# Patient Record
Sex: Female | Born: 1980 | Race: White | Hispanic: No | Marital: Married | Smoking: Never smoker
Health system: Southern US, Community
[De-identification: ages and names within clinical notes are randomized; demographics above are authoritative.]

## PROBLEM LIST (undated history)

## (undated) HISTORY — PX: BREAST SURGERY: SHX581

## (undated) HISTORY — PX: WISDOM TOOTH EXTRACTION: SHX21

## (undated) HISTORY — PX: AUGMENTATION MAMMAPLASTY: SUR837

---

## 1999-04-13 ENCOUNTER — Other Ambulatory Visit: Admission: RE | Admit: 1999-04-13 | Discharge: 1999-04-13 | Payer: Self-pay | Admitting: Family Medicine

## 1999-05-31 ENCOUNTER — Encounter: Payer: Self-pay | Admitting: Family Medicine

## 1999-05-31 ENCOUNTER — Ambulatory Visit (HOSPITAL_COMMUNITY): Admission: RE | Admit: 1999-05-31 | Discharge: 1999-05-31 | Payer: Self-pay | Admitting: Family Medicine

## 1999-08-18 ENCOUNTER — Encounter: Payer: Self-pay | Admitting: Family Medicine

## 1999-08-18 ENCOUNTER — Ambulatory Visit (HOSPITAL_COMMUNITY): Admission: RE | Admit: 1999-08-18 | Discharge: 1999-08-18 | Payer: Self-pay | Admitting: Family Medicine

## 1999-09-03 ENCOUNTER — Inpatient Hospital Stay (HOSPITAL_COMMUNITY): Admission: AD | Admit: 1999-09-03 | Discharge: 1999-09-03 | Payer: Self-pay | Admitting: Family Medicine

## 1999-09-08 ENCOUNTER — Encounter: Payer: Self-pay | Admitting: Family Medicine

## 1999-09-08 ENCOUNTER — Ambulatory Visit (HOSPITAL_COMMUNITY): Admission: RE | Admit: 1999-09-08 | Discharge: 1999-09-08 | Payer: Self-pay | Admitting: Family Medicine

## 1999-09-15 ENCOUNTER — Encounter (HOSPITAL_COMMUNITY): Admission: RE | Admit: 1999-09-15 | Discharge: 1999-10-11 | Payer: Self-pay | Admitting: Family Medicine

## 1999-10-03 ENCOUNTER — Encounter: Payer: Self-pay | Admitting: Family Medicine

## 1999-10-10 ENCOUNTER — Inpatient Hospital Stay (HOSPITAL_COMMUNITY): Admission: AD | Admit: 1999-10-10 | Discharge: 1999-10-13 | Payer: Self-pay | Admitting: Family Medicine

## 1999-10-10 ENCOUNTER — Encounter (INDEPENDENT_AMBULATORY_CARE_PROVIDER_SITE_OTHER): Payer: Self-pay | Admitting: Specialist

## 1999-10-14 ENCOUNTER — Encounter: Admission: RE | Admit: 1999-10-14 | Discharge: 2000-01-10 | Payer: Self-pay | Admitting: Family Medicine

## 1999-10-19 ENCOUNTER — Ambulatory Visit (HOSPITAL_COMMUNITY): Admission: RE | Admit: 1999-10-19 | Discharge: 1999-10-19 | Payer: Self-pay | Admitting: Pediatrics

## 2006-08-30 LAB — CONVERTED CEMR LAB: Pap Smear: NORMAL

## 2006-11-29 ENCOUNTER — Ambulatory Visit: Payer: Self-pay | Admitting: Internal Medicine

## 2006-11-29 DIAGNOSIS — F329 Major depressive disorder, single episode, unspecified: Secondary | ICD-10-CM

## 2006-11-29 DIAGNOSIS — F411 Generalized anxiety disorder: Secondary | ICD-10-CM | POA: Insufficient documentation

## 2006-12-03 ENCOUNTER — Encounter (INDEPENDENT_AMBULATORY_CARE_PROVIDER_SITE_OTHER): Payer: Self-pay | Admitting: Internal Medicine

## 2007-05-19 ENCOUNTER — Ambulatory Visit (HOSPITAL_COMMUNITY): Admission: RE | Admit: 2007-05-19 | Discharge: 2007-05-19 | Payer: Self-pay | Admitting: Family Medicine

## 2009-02-23 ENCOUNTER — Inpatient Hospital Stay (HOSPITAL_COMMUNITY): Admission: AD | Admit: 2009-02-23 | Discharge: 2009-02-23 | Payer: Self-pay | Admitting: Obstetrics and Gynecology

## 2009-03-23 ENCOUNTER — Inpatient Hospital Stay (HOSPITAL_COMMUNITY): Admission: AD | Admit: 2009-03-23 | Discharge: 2009-03-26 | Payer: Self-pay | Admitting: Obstetrics and Gynecology

## 2011-01-08 LAB — CBC
HCT: 32.6 % — ABNORMAL LOW (ref 36.0–46.0)
HCT: 36.2 % (ref 36.0–46.0)
HCT: 36.5 % (ref 36.0–46.0)
Hemoglobin: 12.8 g/dL (ref 12.0–15.0)
Hemoglobin: 13.1 g/dL (ref 12.0–15.0)
MCHC: 35.3 g/dL (ref 30.0–36.0)
MCHC: 35.8 g/dL (ref 30.0–36.0)
MCHC: 35.9 g/dL (ref 30.0–36.0)
MCV: 98.4 fL (ref 78.0–100.0)
MCV: 98.9 fL (ref 78.0–100.0)
MCV: 99.1 fL (ref 78.0–100.0)
Platelets: 182 10*3/uL (ref 150–400)
Platelets: 228 10*3/uL (ref 150–400)
RBC: 3.66 MIL/uL — ABNORMAL LOW (ref 3.87–5.11)
RBC: 3.71 MIL/uL — ABNORMAL LOW (ref 3.87–5.11)
RDW: 13.1 % (ref 11.5–15.5)
RDW: 13.4 % (ref 11.5–15.5)
WBC: 7.3 10*3/uL (ref 4.0–10.5)
WBC: 9.8 10*3/uL (ref 4.0–10.5)

## 2011-01-08 LAB — CCBB MATERNAL DONOR DRAW

## 2011-01-09 LAB — URINALYSIS, ROUTINE W REFLEX MICROSCOPIC
Bilirubin Urine: NEGATIVE
Hgb urine dipstick: NEGATIVE
Ketones, ur: NEGATIVE mg/dL
Nitrite: NEGATIVE
Protein, ur: NEGATIVE mg/dL
Specific Gravity, Urine: 1.015 (ref 1.005–1.030)
Urobilinogen, UA: 0.2 mg/dL (ref 0.0–1.0)

## 2017-11-29 DIAGNOSIS — Z6821 Body mass index (BMI) 21.0-21.9, adult: Secondary | ICD-10-CM | POA: Diagnosis not present

## 2017-11-29 DIAGNOSIS — Z01419 Encounter for gynecological examination (general) (routine) without abnormal findings: Secondary | ICD-10-CM | POA: Diagnosis not present

## 2017-11-29 LAB — HM PAP SMEAR: HM PAP: NEGATIVE

## 2018-01-01 DIAGNOSIS — R1110 Vomiting, unspecified: Secondary | ICD-10-CM | POA: Diagnosis not present

## 2018-01-14 DIAGNOSIS — Z3009 Encounter for other general counseling and advice on contraception: Secondary | ICD-10-CM | POA: Diagnosis not present

## 2018-01-28 DIAGNOSIS — Z30431 Encounter for routine checking of intrauterine contraceptive device: Secondary | ICD-10-CM | POA: Diagnosis not present

## 2018-02-03 DIAGNOSIS — J301 Allergic rhinitis due to pollen: Secondary | ICD-10-CM | POA: Diagnosis not present

## 2018-02-05 DIAGNOSIS — R82998 Other abnormal findings in urine: Secondary | ICD-10-CM | POA: Diagnosis not present

## 2018-02-05 DIAGNOSIS — R102 Pelvic and perineal pain: Secondary | ICD-10-CM | POA: Diagnosis not present

## 2018-02-05 DIAGNOSIS — Z30431 Encounter for routine checking of intrauterine contraceptive device: Secondary | ICD-10-CM | POA: Diagnosis not present

## 2018-03-17 DIAGNOSIS — L2389 Allergic contact dermatitis due to other agents: Secondary | ICD-10-CM | POA: Diagnosis not present

## 2018-08-15 DIAGNOSIS — N760 Acute vaginitis: Secondary | ICD-10-CM | POA: Diagnosis not present

## 2018-09-17 NOTE — Progress Notes (Signed)
Subjective:    Patient ID: Andrea Clark, female    DOB: 06-12-1981, 37 y.o.   MRN: [id number]  HPI:  Andrea Clark is here to establish as a new pt.  She is a pleasant 37 year old female. WUJ:WJXBJYN, depression, chronic idiopathic constipation She lost >55 lbs in 2018 via diet/exercise- AMAZING! She has tried every OTC remedy for constipation- healthy eating, proper hydration, regular exercise, Miralax, Senna, Rx Trulance-without sx control. She estimates to have one BM weekly. She also reports intermittent bloating and diarrhea, she has never had colonoscopy. She has tension HA associated with stress and TMJ- treated with Alprazolam 0.25mg  PRN HS, estimates to use 1-2 times every 2 weeks. She estimates to have migraine with aura once every 3-4 months She reports mood stable, not currently on anti-depressant She denies acute complaints/issues today  Patient Care Team    Relationship Specialty Notifications Start End  [name], NP PCP - General Family Medicine  09/18/18   [name], MD Consulting Physician Obstetrics and Gynecology  09/18/18   [name]  Optometry  09/18/18   Corrington, Meredith Mody, MD  Family Medicine  09/18/18 09/18/18    Patient Active Problem List   Diagnosis Date Noted  . Healthcare maintenance 09/18/2018  . Chronic idiopathic constipation 09/18/2018  . ANXIETY 11/29/2006  . DEPRESSION 11/29/2006     History reviewed. No pertinent past medical history.   Past Surgical History:  Procedure Laterality Date  . BREAST SURGERY     augmentation  . WISDOM TOOTH EXTRACTION       Family History  Problem Relation Age of Onset  . Depression Mother   . Alcohol abuse Father   . Hypertension Father      Social History   Substance and Sexual Activity  Drug Use Never     Social History   Substance and Sexual Activity  Alcohol Use Never  . Frequency: Never     Social History   Tobacco Use  Smoking Status Never Smoker   Smokeless Tobacco Never Used     Outpatient Encounter Medications as of 09/18/2018  Medication Sig  . linaclotide (LINZESS) 145 MCG CAPS capsule Take 1 capsule (145 mcg total) by mouth daily before breakfast.   No facility-administered encounter medications on file as of 09/18/2018.     Allergies: Patient has no known allergies.  Body mass index is 21.81 kg/m.  Blood pressure 111/71, pulse 66, temperature 98.1 F (36.7 C), temperature source Oral, height 5' 5.75" (1.67 m), weight 134 lb 1.6 oz (60.8 kg), SpO2 100 %.     Review of Systems  Constitutional: Positive for fatigue. Negative for activity change, appetite change, chills, diaphoresis, fever and unexpected weight change.  HENT: Negative for congestion.   Eyes: Negative for visual disturbance.  Respiratory: Negative for cough, chest tightness, shortness of breath, wheezing and stridor.   Cardiovascular: Negative for chest pain, palpitations and leg swelling.  Gastrointestinal: Positive for constipation and diarrhea. Negative for abdominal distention, abdominal pain, anal bleeding, blood in stool, nausea and vomiting.  Genitourinary: Negative for difficulty urinating, dysuria, flank pain, hematuria and menstrual problem.  Musculoskeletal: Negative for arthralgias, back pain, gait problem, joint swelling, myalgias, neck pain and neck stiffness.  Skin: Negative for color change, pallor, rash and wound.  Neurological: Positive for headaches. Negative for dizziness.  Hematological: Does not bruise/bleed easily.  Psychiatric/Behavioral: Positive for sleep disturbance. Negative for agitation, behavioral problems, confusion, decreased concentration, dysphoric mood, hallucinations, self-injury and suicidal ideas. The patient  is not nervous/anxious and is not hyperactive.        Objective:   Physical Exam Vitals signs and nursing note reviewed.  Constitutional:      General: She is not in acute distress.    Appearance:  Normal appearance. She is normal weight. She is not ill-appearing, toxic-appearing or diaphoretic.  HENT:     Head: Normocephalic and atraumatic.     Nose: Nose normal.  Eyes:     Extraocular Movements: Extraocular movements intact.     Conjunctiva/sclera: Conjunctivae normal.     Pupils: Pupils are equal, round, and reactive to light.  Cardiovascular:     Rate and Rhythm: Normal rate and regular rhythm.     Pulses: Normal pulses.     Heart sounds: Normal heart sounds. No murmur. No friction rub. No gallop.   Pulmonary:     Effort: Pulmonary effort is normal. No respiratory distress.     Breath sounds: Normal breath sounds. No stridor. No wheezing, rhonchi or rales.  Chest:     Chest wall: No tenderness.  Skin:    Capillary Refill: Capillary refill takes less than 2 seconds.  Neurological:     Mental Status: She is alert and oriented to person, place, and time.     Cranial Nerves: No cranial nerve deficit.     Sensory: No sensory deficit.     Motor: No weakness.     Coordination: Coordination normal.     Gait: Gait normal.     Deep Tendon Reflexes: Reflexes normal.  Psychiatric:        Mood and Affect: Mood normal.        Behavior: Behavior normal.        Thought Content: Thought content normal.        Judgment: Judgment normal.       Assessment & Plan:   1. Healthcare maintenance   2. Chronic idiopathic constipation     Healthcare maintenance AMAZING JOB ON YOUR HEALTHY WEIGHT LOSS!!!! Continue to drink plenty of water, eat healthy, and exercise regularly. Please start once daily Linzess 145mcg When you need refill on Alprazolam 0.25mg  for nightly tension headache/teeth grinding- please call your pharmacy and they will contact our clinic. Please schedule complete physical next month, fasting lab appt the week prior.  Chronic idiopathic constipation Continue to drink plenty of water, eat a diet rich in fiber, exercise regularly Please start once daily Linzess  145mcg   DEPRESSION Mood stable Not currently on anti-depressant     FOLLOW-UP:  Return in about 4 weeks (around 10/16/2018) for CPE, Fasting Labs.

## 2018-09-18 ENCOUNTER — Encounter: Payer: Self-pay | Admitting: Adult Health

## 2018-09-18 ENCOUNTER — Ambulatory Visit (INDEPENDENT_AMBULATORY_CARE_PROVIDER_SITE_OTHER): Payer: 59 | Admitting: Adult Health

## 2018-09-18 VITALS — BP 111/71 | HR 66 | Temp 98.1°F | Ht 65.75 in | Wt 134.1 lb

## 2018-09-18 DIAGNOSIS — K5904 Chronic idiopathic constipation: Secondary | ICD-10-CM | POA: Diagnosis not present

## 2018-09-18 DIAGNOSIS — Z0000 Encounter for general adult medical examination without abnormal findings: Secondary | ICD-10-CM

## 2018-09-18 MED ORDER — LINACLOTIDE 145 MCG PO CAPS: 145.0000 ug | ORAL_CAPSULE | Freq: Every day | ORAL | 11 refills | Status: DC

## 2018-09-18 NOTE — Assessment & Plan Note (Signed)
Continue to drink plenty of water, eat a diet rich in fiber, exercise regularly Please start once daily Linzess 

## 2018-09-18 NOTE — Assessment & Plan Note (Signed)
AMAZING JOB ON YOUR HEALTHY WEIGHT LOSS!!!! Continue to drink plenty of water, eat healthy, and exercise regularly. Please start once daily Linzess When you need refill on Alprazolam 0.25mg  for nightly tension headache/teeth grinding- please call your pharmacy and they will contact our clinic. Please schedule complete physical next month, fasting lab appt the week prior.

## 2018-09-18 NOTE — Patient Instructions (Addendum)
Mediterranean Diet A Mediterranean diet refers to food and lifestyle choices that are based on the traditions of countries located on the [employer]. This way of eating has been shown to help prevent certain conditions and improve outcomes for people who have chronic diseases, like kidney disease and heart disease. What are tips for following this plan? Lifestyle  Cook and eat meals together with your family, when possible.  Drink enough fluid to keep your urine clear or pale yellow.  Be physically active every day. This includes: ? Aerobic exercise like running or swimming. ? Leisure activities like gardening, walking, or housework.  Get 7-8 hours of sleep each night.  If recommended by your health care provider, drink red wine in moderation. This means 1 glass a day for nonpregnant women and 2 glasses a day for men. A glass of wine equals 5 oz (150 mL). Reading food labels   Check the serving size of packaged foods. For foods such as rice and pasta, the serving size refers to the amount of cooked product, not dry.  Check the total fat in packaged foods. Avoid foods that have saturated fat or trans fats.  Check the ingredients list for added sugars, such as corn syrup. Shopping  At the grocery store, buy most of your food from the areas near the walls of the store. This includes: ? Fresh fruits and vegetables (produce). ? Grains, beans, nuts, and seeds. Some of these may be available in unpackaged forms or large amounts (in bulk). ? Fresh seafood. ? Poultry and eggs. ? Low-fat dairy products.  Buy whole ingredients instead of prepackaged foods.  Buy fresh fruits and vegetables in-season from local farmers markets.  Buy frozen fruits and vegetables in resealable bags.  If you do not have access to quality fresh seafood, buy precooked frozen shrimp or canned fish, such as tuna, salmon, or sardines.  Buy small amounts of raw or cooked vegetables, salads, or olives from  the deli or salad bar at your store.  Stock your pantry so you always have certain foods on hand, such as olive oil, canned tuna, canned tomatoes, rice, pasta, and beans. Cooking  Cook foods with extra-virgin olive oil instead of using butter or other vegetable oils.  Have meat as a side dish, and have vegetables or grains as your main dish. This means having meat in small portions or adding small amounts of meat to foods like pasta or stew.  Use beans or vegetables instead of meat in common dishes like chili or lasagna.  Experiment with different cooking methods. Try roasting or broiling vegetables instead of steaming or sauteing them.  Add frozen vegetables to soups, stews, pasta, or rice.  Add nuts or seeds for added healthy fat at each meal. You can add these to yogurt, salads, or vegetable dishes.  Marinate fish or vegetables using olive oil, lemon juice, garlic, and fresh herbs. Meal planning   Plan to eat 1 vegetarian meal one day each week. Try to work up to 2 vegetarian meals, if possible.  Eat seafood 2 or more times a week.  Have healthy snacks readily available, such as: ? Vegetable sticks with hummus. ? [place] yogurt. ? Fruit and nut trail mix.  Eat balanced meals throughout the week. This includes: ? Fruit: 2-3 servings a day ? Vegetables: 4-5 servings a day ? Low-fat dairy: 2 servings a day ? Fish, poultry, or lean meat: 1 serving a day ? Beans and legumes: 2 or more servings a week ?  Nuts and seeds: 1-2 servings a day ? Whole grains: 6-8 servings a day ? Extra-virgin olive oil: 3-4 servings a day  Limit red meat and sweets to only a few servings a month What are my food choices?  Mediterranean diet ? Recommended ? Grains: Whole-grain pasta. Brown rice. Bulgar wheat. Polenta. Couscous. Whole-wheat bread. [place] Cobbatmeal. Quinoa. ? Vegetables: Artichokes. Beets. Broccoli. Cabbage. Carrots. Eggplant. Green beans. Chard. Kale. Spinach. Onions. Leeks. Peas. Squash.  Tomatoes. Peppers. Radishes. ? Fruits: Apples. Apricots. Avocado. Berries. Bananas. Cherries. Dates. Figs. Grapes. Lemons. Melon. Oranges. Peaches. Plums. Pomegranate. ? Meats and other protein foods: Beans. Almonds. Sunflower seeds. Pine nuts. Peanuts. Cod. Salmon. Scallops. Shrimp. Tuna. Tilapia. Clams. Oysters. Eggs. ? Dairy: Low-fat milk. Cheese. Greek yogurt. ? Beverages: Water. Red wine. Herbal tea. ? Fats and oils: Extra virgin olive oil. Avocado oil. Grape seed oil. ? Sweets and desserts: [place] yogurt with honey. Baked apples. Poached pears. Trail mix. ? Seasoning and other foods: Basil. Cilantro. Coriander. Cumin. Mint. Parsley. Sage. Rosemary. Tarragon. Garlic. Oregano. Thyme. Pepper. Balsalmic vinegar. Tahini. Hummus. Tomato sauce. Olives. Mushrooms. ? Limit these ? Grains: Prepackaged pasta or rice dishes. Prepackaged cereal with added sugar. ? Vegetables: Deep fried potatoes (french fries). ? Fruits: Fruit canned in syrup. ? Meats and other protein foods: Beef. Pork. Lamb. Poultry with skin. Hot dogs. Tomasa BlaseBacon. ? Dairy: Ice cream. Sour cream. Whole milk. ? Beverages: Juice. Sugar-sweetened soft drinks. Beer. Liquor and spirits. ? Fats and oils: Butter. Canola oil. Vegetable oil. Beef fat (tallow). Lard. ? Sweets and desserts: Cookies. Cakes. Pies. Candy. ? Seasoning and other foods: Mayonnaise. Premade sauces and marinades. ? The items listed may not be a complete list. Talk with your dietitian about what dietary choices are right for you. Summary  The Mediterranean diet includes both food and lifestyle choices.  Eat a variety of fresh fruits and vegetables, beans, nuts, seeds, and whole grains.  Limit the amount of red meat and sweets that you eat.  Talk with your health care provider about whether it is safe for you to drink red wine in moderation. This means 1 glass a day for nonpregnant women and 2 glasses a day for men. A glass of wine equals 5 oz (150 mL). This information  is not intended to replace advice given to you by your health care provider. Make sure you discuss any questions you have with your health care provider. Document Released: 05/10/2016 Document Revised: 06/12/2016 Document Reviewed: 05/10/2016 Elsevier Interactive Patient Education  2019 Elsevier Inc.   AMAZING JOB ON YOUR HEALTHY WEIGHT LOSS!!!! Continue to drink plenty of water, eat healthy, and exercise regularly. Please start once daily Linzess 145mcg When you need refill on Alprazolam 0.25mg  for nightly tension headache/teeth grinding- please call your pharmacy and they will contact our clinic. Please schedule complete physical next month, fasting lab appt the week prior. WELCOME TO THE PRACTICE!

## 2018-09-18 NOTE — Assessment & Plan Note (Signed)
Mood stable Not currently on anti-depressant

## 2018-09-19 ENCOUNTER — Ambulatory Visit: Payer: Self-pay | Admitting: Adult Health

## 2018-09-19 ENCOUNTER — Encounter: Payer: Self-pay | Admitting: Adult Health

## 2018-10-20 ENCOUNTER — Other Ambulatory Visit: Payer: Self-pay | Admitting: Adult Health

## 2018-10-20 ENCOUNTER — Other Ambulatory Visit (INDEPENDENT_AMBULATORY_CARE_PROVIDER_SITE_OTHER): Payer: 59

## 2018-10-20 DIAGNOSIS — Z0000 Encounter for general adult medical examination without abnormal findings: Secondary | ICD-10-CM

## 2018-10-20 NOTE — Progress Notes (Signed)
Subjective:    Patient ID: Andrea Clark, female    DOB: 1980-12-25, 38 y.o.   MRN: [id number]  HPI :  Andrea Clark presents for CPE She continues to walk/elliptical machine daily She drinks >80 oz water/day and follows heart healthy diet She did not start Linzess rx for fear of recurrent yeast infections She denies acute complaints  Healthcare Maintenance: PAP-UTD, 11/29/17, last normal Mammogram-N/A Immunizations-Declined influenza vaccination   Patient Care Team    Relationship Specialty Notifications Start End  Julaine Fusi, NP PCP - General Family Medicine  09/18/18   [name], MD Consulting Physician Obstetrics and Gynecology  09/18/18   [name]  Optometry  09/18/18     Patient Active Problem List   Diagnosis Date Noted  . Healthcare maintenance 09/18/2018  . Chronic idiopathic constipation 09/18/2018  . ANXIETY 11/29/2006  . DEPRESSION 11/29/2006     History reviewed. No pertinent past medical history.   Past Surgical History:  Procedure Laterality Date  . BREAST SURGERY     augmentation  . WISDOM TOOTH EXTRACTION       Family History  Problem Relation Age of Onset  . Depression Mother   . Alcohol abuse Father   . Hypertension Father      Social History   Substance and Sexual Activity  Drug Use Never     Social History   Substance and Sexual Activity  Alcohol Use Never  . Frequency: Never     Social History   Tobacco Use  Smoking Status Never Smoker  Smokeless Tobacco Never Used     Outpatient Encounter Medications as of 10/23/2018  Medication Sig  . [DISCONTINUED] linaclotide (LINZESS) 145 MCG CAPS capsule Take 1 capsule (145 mcg total) by mouth daily before breakfast.   No facility-administered encounter medications on file as of 10/23/2018.     Allergies: Patient has no known allergies.  Body mass index is 21.81 kg/m.  Blood pressure 106/71, pulse 65, temperature (!) 97.5 F (36.4 C), temperature  source Oral, height 5' 5.75" (1.67 m), weight 134 lb 1.6 oz (60.8 kg), SpO2 100 %.  Review of Systems  Constitutional: Positive for fatigue. Negative for activity change, appetite change, chills, diaphoresis, fever and unexpected weight change.  HENT: Negative for congestion.   Eyes: Negative for visual disturbance.  Respiratory: Negative for cough, chest tightness, shortness of breath, wheezing and stridor.   Cardiovascular: Negative for chest pain, palpitations and leg swelling.  Gastrointestinal: Negative for abdominal distention, abdominal pain, blood in stool, constipation, diarrhea, nausea and vomiting.  Genitourinary: Negative for difficulty urinating, flank pain, menstrual problem and vaginal discharge.  Musculoskeletal: Negative for arthralgias, back pain, gait problem, joint swelling, myalgias, neck pain and neck stiffness.  Skin: Negative for color change, pallor, rash and wound.  Neurological: Negative for dizziness and headaches.  Hematological: Does not bruise/bleed easily.  Psychiatric/Behavioral: Negative for agitation, behavioral problems, confusion, decreased concentration, dysphoric mood, hallucinations, self-injury, sleep disturbance and suicidal ideas. The patient is not nervous/anxious and is not hyperactive.       Objective:   Physical Exam Vitals signs and nursing note reviewed.  Constitutional:      General: She is not in acute distress.    Appearance: She is normal weight. She is not ill-appearing, toxic-appearing or diaphoretic.  HENT:     Head: Normocephalic and atraumatic.     Right Ear: Tympanic membrane, ear canal and external ear normal. There is no impacted cerumen.     Left Ear: Tympanic  membrane, ear canal and external ear normal. There is no impacted cerumen.     Nose: No congestion or rhinorrhea.     Mouth/Throat:     Mouth: Mucous membranes are dry.     Pharynx: Oropharynx is clear. No oropharyngeal exudate or posterior oropharyngeal erythema.   Eyes:     General: No scleral icterus.       Right eye: No discharge.        Left eye: No discharge.     Extraocular Movements: Extraocular movements intact.     Conjunctiva/sclera: Conjunctivae normal.     Pupils: Pupils are equal, round, and reactive to light.  Neck:     Musculoskeletal: Normal range of motion.  Cardiovascular:     Rate and Rhythm: Normal rate.     Pulses: Normal pulses.     Heart sounds: Normal heart sounds. No murmur. No friction rub.  Pulmonary:     Effort: Pulmonary effort is normal. No respiratory distress.     Breath sounds: Normal breath sounds. No stridor. No wheezing, rhonchi or rales.  Chest:     Chest wall: No tenderness.  Abdominal:     General: Abdomen is flat. Bowel sounds are normal. There is no distension.     Palpations: There is no mass.     Tenderness: There is no abdominal tenderness. There is no right CVA tenderness, left CVA tenderness, guarding or rebound.     Hernia: No hernia is present.  Musculoskeletal: Normal range of motion.  Lymphadenopathy:     Cervical: No cervical adenopathy.  Skin:    General: Skin is warm and dry.     Capillary Refill: Capillary refill takes less than 2 seconds.  Neurological:     Mental Status: She is alert and oriented to person, place, and time.  Psychiatric:        Mood and Affect: Mood normal.        Behavior: Behavior normal.        Thought Content: Thought content normal.        Judgment: Judgment normal.       Assessment & Plan:   1. Healthcare maintenance   2. Chronic idiopathic constipation     Healthcare maintenance Overall you are doing a GREAT JOB taking care of yourself! Recent lab work is stable. Continue to drink plenty of water, eat a heart healthy diet, and continue regular exercise. If you ever develop yeast infection symptoms, please call clinic and we will send in Diflucan rx. Recommend annual physical with fasting labs.  Chronic idiopathic constipation Declined started  Linzess due to fear of recurrent yeast inf Continue to drink plenty of water and eat plenty of fiber Continue regular exercise     FOLLOW-UP:  Return in about 1 year (around 10/24/2019) for CPE, Fasting Labs.

## 2018-10-21 LAB — COMPREHENSIVE METABOLIC PANEL
A/G RATIO: 1.6 (ref 1.2–2.2)
ALT: 18 IU/L (ref 0–32)
AST: 14 IU/L (ref 0–40)
Albumin: 4.1 g/dL (ref 3.8–4.8)
Alkaline Phosphatase: 65 IU/L (ref 39–117)
BUN/Creatinine Ratio: 18 (ref 9–23)
BUN: 11 mg/dL (ref 6–20)
Bilirubin Total: 0.6 mg/dL (ref 0.0–1.2)
CHLORIDE: 103 mmol/L (ref 96–106)
CO2: 19 mmol/L — ABNORMAL LOW (ref 20–29)
Calcium: 9 mg/dL (ref 8.7–10.2)
Creatinine, Ser: 0.62 mg/dL (ref 0.57–1.00)
GFR calc non Af Amer: 114 mL/min/{1.73_m2} (ref >59)
GFR, EST AFRICAN AMERICAN: 131 mL/min/{1.73_m2} (ref >59)
GLOBULIN, TOTAL: 2.6 g/dL (ref 1.5–4.5)
Glucose: 87 mg/dL (ref 65–99)
POTASSIUM: 4.7 mmol/L (ref 3.5–5.2)
SODIUM: 142 mmol/L (ref 134–144)
TOTAL PROTEIN: 6.7 g/dL (ref 6.0–8.5)

## 2018-10-21 LAB — CBC WITH DIFFERENTIAL/PLATELET
BASOS: 1 %
Basophils Absolute: 0.1 10*3/uL (ref 0.0–0.2)
EOS (ABSOLUTE): 0.1 10*3/uL (ref 0.0–0.4)
Eos: 2 %
Hematocrit: 37.4 % (ref 34.0–46.6)
Hemoglobin: 13.5 g/dL (ref 11.1–15.9)
Immature Grans (Abs): 0 10*3/uL (ref 0.0–0.1)
Immature Granulocytes: 0 %
Lymphocytes Absolute: 1.4 10*3/uL (ref 0.7–3.1)
Lymphs: 33 %
MCH: 32.9 pg (ref 26.6–33.0)
MCHC: 36.1 g/dL — ABNORMAL HIGH (ref 31.5–35.7)
MCV: 91 fL (ref 79–97)
MONOS ABS: 0.5 10*3/uL (ref 0.1–0.9)
Monocytes: 12 %
Neutrophils Absolute: 2.1 10*3/uL (ref 1.4–7.0)
Neutrophils: 52 %
PLATELETS: 213 10*3/uL (ref 150–450)
RBC: 4.1 x10E6/uL (ref 3.77–5.28)
RDW: 12.1 % (ref 11.7–15.4)
WBC: 4.2 10*3/uL (ref 3.4–10.8)

## 2018-10-21 LAB — LIPID PANEL
CHOL/HDL RATIO: 2.4 ratio (ref 0.0–4.4)
Cholesterol, Total: 144 mg/dL (ref 100–199)
HDL: 60 mg/dL (ref >39)
LDL Calculated: 77 mg/dL (ref 0–99)
Triglycerides: 35 mg/dL (ref 0–149)
VLDL Cholesterol Cal: 7 mg/dL (ref 5–40)

## 2018-10-21 LAB — TSH: TSH: 1.87 u[IU]/mL (ref 0.450–4.500)

## 2018-10-21 LAB — HEMOGLOBIN A1C
Est. average glucose Bld gHb Est-mCnc: 91 mg/dL
HEMOGLOBIN A1C: 4.8 % (ref 4.8–5.6)

## 2018-10-23 ENCOUNTER — Encounter: Payer: Self-pay | Admitting: Adult Health

## 2018-10-23 ENCOUNTER — Ambulatory Visit (INDEPENDENT_AMBULATORY_CARE_PROVIDER_SITE_OTHER): Payer: 59 | Admitting: Adult Health

## 2018-10-23 VITALS — BP 106/71 | HR 65 | Temp 97.5°F | Ht 65.75 in | Wt 134.1 lb

## 2018-10-23 DIAGNOSIS — Z0000 Encounter for general adult medical examination without abnormal findings: Secondary | ICD-10-CM | POA: Diagnosis not present

## 2018-10-23 DIAGNOSIS — K5904 Chronic idiopathic constipation: Secondary | ICD-10-CM | POA: Diagnosis not present

## 2018-10-23 NOTE — Assessment & Plan Note (Signed)
Declined started Linzess due to fear of recurrent yeast inf Continue to drink plenty of water and eat plenty of fiber Continue regular exercise

## 2018-10-23 NOTE — Assessment & Plan Note (Signed)
Overall you are doing a GREAT JOB taking care of yourself! Recent lab work is stable. Continue to drink plenty of water, eat a heart healthy diet, and continue regular exercise. If you ever develop yeast infection symptoms, please call clinic and we will send in Diflucan rx. Recommend annual physical with fasting labs.

## 2018-10-23 NOTE — Patient Instructions (Addendum)
Preventive Care for Adults, Female  A healthy lifestyle and preventive care can promote health and wellness. Preventive health guidelines for women include the following key practices.   A routine yearly physical is a good way to check with your health care provider about your health and preventive screening. It is a chance to share any concerns and updates on your health and to receive a thorough exam.   Visit your dentist for a routine exam and preventive care every 6 months. Brush your teeth twice a day and floss once a day. Good oral hygiene prevents tooth decay and gum disease.   The frequency of eye exams is based on your age, health, family medical history, use of contact lenses, and other factors. Follow your health care provider's recommendations for frequency of eye exams.   Eat a healthy diet. Foods like vegetables, fruits, whole grains, low-fat dairy products, and lean protein foods contain the nutrients you need without too many calories. Decrease your intake of foods high in solid fats, added sugars, and salt. Eat the right amount of calories for you.Get information about a proper diet from your health care provider, if necessary.   Regular physical exercise is one of the most important things you can do for your health. Most adults should get at least 150 minutes of moderate-intensity exercise (any activity that increases your heart rate and causes you to sweat) each week. In addition, most adults need muscle-strengthening exercises on 2 or more days a week.   Maintain a healthy weight. The body mass index (BMI) is a screening tool to identify possible weight problems. It provides an estimate of body fat based on height and weight. Your health care provider can find your BMI, and can help you achieve or maintain a healthy weight.For adults 20 years and older:   - A BMI below 18.5 is considered underweight.   - A BMI of 18.5 to 24.9 is normal.   - A BMI of 25 to 29.9 is  considered overweight.   - A BMI of 30 and above is considered obese.   Maintain normal blood lipids and cholesterol levels by exercising and minimizing your intake of trans and saturated fats.  Eat a balanced diet with plenty of fruit and vegetables. Blood tests for lipids and cholesterol should begin at age 20 and be repeated every 5 years minimum.  If your lipid or cholesterol levels are high, you are over 40, or you are at high risk for heart disease, you may need your cholesterol levels checked more frequently.Ongoing high lipid and cholesterol levels should be treated with medicines if diet and exercise are not working.   If you smoke, find out from your health care provider how to quit. If you do not use tobacco, do not start.   Lung cancer screening is recommended for adults aged 55-80 years who are at high risk for developing lung cancer because of a history of smoking. A yearly low-dose CT scan of the lungs is recommended for people who have at least a 30-pack-year history of smoking and are a current smoker or have quit within the past 15 years. A pack year of smoking is smoking an average of 1 pack of cigarettes a day for 1 year (for example: 1 pack a day for 30 years or 2 packs a day for 15 years). Yearly screening should continue until the smoker has stopped smoking for at least 15 years. Yearly screening should be stopped for people who develop a   health problem that would prevent them from having lung cancer treatment.   If you are pregnant, do not drink alcohol. If you are breastfeeding, be very cautious about drinking alcohol. If you are not pregnant and choose to drink alcohol, do not have more than 1 drink per day. One drink is considered to be 12 ounces (355 mL) of beer, 5 ounces (148 mL) of wine, or 1.5 ounces (44 mL) of liquor.   Avoid use of street drugs. Do not share needles with anyone. Ask for help if you need support or instructions about stopping the use of  drugs.   High blood pressure causes heart disease and increases the risk of stroke. Your blood pressure should be checked at least yearly.  Ongoing high blood pressure should be treated with medicines if weight loss and exercise do not work.   If you are 69-55 years old, ask your health care provider if you should take aspirin to prevent strokes.   Diabetes screening involves taking a blood sample to check your fasting blood sugar level. This should be done once every 3 years, after age 38, if you are within normal weight and without risk factors for diabetes. Testing should be considered at a younger age or be carried out more frequently if you are overweight and have at least 1 risk factor for diabetes.   Breast cancer screening is essential preventive care for women. You should practice "breast self-awareness."  This means understanding the normal appearance and feel of your breasts and may include breast self-examination.  Any changes detected, no matter how small, should be reported to a health care provider.  Women in their 80s and 30s should have a clinical breast exam (CBE) by a health care provider as part of a regular health exam every 1 to 3 years.  After age 66, women should have a CBE every year.  Starting at age 1, women should consider having a mammogram (breast X-ray test) every year.  Women who have a family history of breast cancer should talk to their health care provider about genetic screening.  Women at a high risk of breast cancer should talk to their health care providers about having an MRI and a mammogram every year.   -Breast cancer gene (BRCA)-related cancer risk assessment is recommended for women who have family members with BRCA-related cancers. BRCA-related cancers include breast, ovarian, tubal, and peritoneal cancers. Having family members with these cancers may be associated with an increased risk for harmful changes (mutations) in the breast cancer genes BRCA1 and  BRCA2. Results of the assessment will determine the need for genetic counseling and BRCA1 and BRCA2 testing.   The Pap test is a screening test for cervical cancer. A Pap test can show cell changes on the cervix that might become cervical cancer if left untreated. A Pap test is a procedure in which cells are obtained and examined from the lower end of the uterus (cervix).   - Women should have a Pap test starting at age 57.   - Between ages 90 and 70, Pap tests should be repeated every 2 years.   - Beginning at age 63, you should have a Pap test every 3 years as long as the past 3 Pap tests have been normal.   - Some women have medical problems that increase the chance of getting cervical cancer. Talk to your health care provider about these problems. It is especially important to talk to your health care provider if a  new problem develops soon after your last Pap test. In these cases, your health care provider may recommend more frequent screening and Pap tests.   - The above recommendations are the same for women who have or have not gotten the vaccine for human papillomavirus (HPV).   - If you had a hysterectomy for a problem that was not cancer or a condition that could lead to cancer, then you no longer need Pap tests. Even if you no longer need a Pap test, a regular exam is a good idea to make sure no other problems are starting.   - If you are between ages 36 and 66 years, and you have had normal Pap tests going back 10 years, you no longer need Pap tests. Even if you no longer need a Pap test, a regular exam is a good idea to make sure no other problems are starting.   - If you have had past treatment for cervical cancer or a condition that could lead to cancer, you need Pap tests and screening for cancer for at least 20 years after your treatment.   - If Pap tests have been discontinued, risk factors (such as a new sexual partner) need to be reassessed to determine if screening should  be resumed.   - The HPV test is an additional test that may be used for cervical cancer screening. The HPV test looks for the virus that can cause the cell changes on the cervix. The cells collected during the Pap test can be tested for HPV. The HPV test could be used to screen women aged 70 years and older, and should be used in women of any age who have unclear Pap test results. After the age of 67, women should have HPV testing at the same frequency as a Pap test.   Colorectal cancer can be detected and often prevented. Most routine colorectal cancer screening begins at the age of 57 years and continues through age 26 years. However, your health care provider may recommend screening at an earlier age if you have risk factors for colon cancer. On a yearly basis, your health care provider may provide home test kits to check for hidden blood in the stool.  Use of a small camera at the end of a tube, to directly examine the colon (sigmoidoscopy or colonoscopy), can detect the earliest forms of colorectal cancer. Talk to your health care provider about this at age 23, when routine screening begins. Direct exam of the colon should be repeated every 5 -10 years through age 49 years, unless early forms of pre-cancerous polyps or small growths are found.   People who are at an increased risk for hepatitis B should be screened for this virus. You are considered at high risk for hepatitis B if:  -You were born in a country where hepatitis B occurs often. Talk with your health care provider about which countries are considered high risk.  - Your parents were born in a high-risk country and you have not received a shot to protect against hepatitis B (hepatitis B vaccine).  - You have HIV or AIDS.  - You use needles to inject street drugs.  - You live with, or have sex with, someone who has Hepatitis B.  - You get hemodialysis treatment.  - You take certain medicines for conditions like cancer, organ  transplantation, and autoimmune conditions.   Hepatitis C blood testing is recommended for all people born from [birth year] through 1965 and any individual  with known risks for hepatitis C.   Practice safe sex. Use condoms and avoid high-risk sexual practices to reduce the spread of sexually transmitted infections (STIs). STIs include gonorrhea, chlamydia, syphilis, trichomonas, herpes, HPV, and human immunodeficiency virus (HIV). Herpes, HIV, and HPV are viral illnesses that have no cure. They can result in disability, cancer, and death. Sexually active women aged 25 years and younger should be checked for chlamydia. Older women with new or multiple partners should also be tested for chlamydia. Testing for other STIs is recommended if you are sexually active and at increased risk.   Osteoporosis is a disease in which the bones lose minerals and strength with aging. This can result in serious bone fractures or breaks. The risk of osteoporosis can be identified using a bone density scan. Women ages 65 years and over and women at risk for fractures or osteoporosis should discuss screening with their health care providers. Ask your health care provider whether you should take a calcium supplement or vitamin D to There are also several preventive steps women can take to avoid osteoporosis and resulting fractures or to keep osteoporosis from worsening. -->Recommendations include:  Eat a balanced diet high in fruits, vegetables, calcium, and vitamins.  Get enough calcium. The recommended total intake of is 1,200 mg daily; for best absorption, if taking supplements, divide doses into 250-500 mg doses throughout the day. Of the two types of calcium, calcium carbonate is best absorbed when taken with food but calcium citrate can be taken on an empty stomach.  Get enough vitamin D. NAMS and the National Osteoporosis Foundation recommend at least 1,000 IU per day for women age 50 and over who are at risk of vitamin D  deficiency. Vitamin D deficiency can be caused by inadequate sun exposure (for example, those who live in northern latitudes).  Avoid alcohol and smoking. Heavy alcohol intake (more than 7 drinks per week) increases the risk of falls and hip fracture and women smokers tend to lose bone more rapidly and have lower bone mass than nonsmokers. Stopping smoking is one of the most important changes women can make to improve their health and decrease risk for disease.  Be physically active every day. Weight-bearing exercise (for example, fast walking, hiking, jogging, and weight training) may strengthen bones or slow the rate of bone loss that comes with aging. Balancing and muscle-strengthening exercises can reduce the risk of falling and fracture.  Consider therapeutic medications. Currently, several types of effective drugs are available. Healthcare providers can recommend the type most appropriate for each woman.  Eliminate environmental factors that may contribute to accidents. Falls cause nearly 90% of all osteoporotic fractures, so reducing this risk is an important bone-health strategy. Measures include ample lighting, removing obstructions to walking, using nonskid rugs on floors, and placing mats and/or grab bars in showers.  Be aware of medication side effects. Some common medicines make bones weaker. These include a type of steroid drug called glucocorticoids used for arthritis and asthma, some antiseizure drugs, certain sleeping pills, treatments for endometriosis, and some cancer drugs. An overactive thyroid gland or using too much thyroid hormone for an underactive thyroid can also be a problem. If you are taking these medicines, talk to your doctor about what you can do to help protect your bones.reduce the rate of osteoporosis.    Menopause can be associated with physical symptoms and risks. Hormone replacement therapy is available to decrease symptoms and risks. You should talk to your  health care provider   about whether hormone replacement therapy is right for you.   Use sunscreen. Apply sunscreen liberally and repeatedly throughout the day. You should seek shade when your shadow is shorter than you. Protect yourself by wearing long sleeves, pants, a wide-brimmed hat, and sunglasses year round, whenever you are outdoors.   Once a month, do a whole body skin exam, using a mirror to look at the skin on your back. Tell your health care provider of new moles, moles that have irregular borders, moles that are larger than a pencil eraser, or moles that have changed in shape or color.   -Stay current with required vaccines (immunizations).   Influenza vaccine. All adults should be immunized every year.  Tetanus, diphtheria, and acellular pertussis (Td, Tdap) vaccine. Pregnant women should receive 1 dose of Tdap vaccine during each pregnancy. The dose should be obtained regardless of the length of time since the last dose. Immunization is preferred during the 27th 36th week of gestation. An adult who has not previously received Tdap or who does not know her vaccine status should receive 1 dose of Tdap. This initial dose should be followed by tetanus and diphtheria toxoids (Td) booster doses every 10 years. Adults with an unknown or incomplete history of completing a 3-dose immunization series with Td-containing vaccines should begin or complete a primary immunization series including a Tdap dose. Adults should receive a Td booster every 10 years.  Varicella vaccine. An adult without evidence of immunity to varicella should receive 2 doses or a second dose if she has previously received 1 dose. Pregnant females who do not have evidence of immunity should receive the first dose after pregnancy. This first dose should be obtained before leaving the health care facility. The second dose should be obtained 4 8 weeks after the first dose.  Human papillomavirus (HPV) vaccine. Females aged 13 26  years who have not received the vaccine previously should obtain the 3-dose series. The vaccine is not recommended for use in pregnant females. However, pregnancy testing is not needed before receiving a dose. If a female is found to be pregnant after receiving a dose, no treatment is needed. In that case, the remaining doses should be delayed until after the pregnancy. Immunization is recommended for any person with an immunocompromised condition through the age of 26 years if she did not get any or all doses earlier. During the 3-dose series, the second dose should be obtained 4 8 weeks after the first dose. The third dose should be obtained 24 weeks after the first dose and 16 weeks after the second dose.  Zoster vaccine. One dose is recommended for adults aged 60 years or older unless certain conditions are present.  Measles, mumps, and rubella (MMR) vaccine. Adults born before 1957 generally are considered immune to measles and mumps. Adults born in 1957 or later should have 1 or more doses of MMR vaccine unless there is a contraindication to the vaccine or there is laboratory evidence of immunity to each of the three diseases. A routine second dose of MMR vaccine should be obtained at least 28 days after the first dose for students attending postsecondary schools, health care workers, or international travelers. People who received inactivated measles vaccine or an unknown type of measles vaccine during 1963 1967 should receive 2 doses of MMR vaccine. People who received inactivated mumps vaccine or an unknown type of mumps vaccine before 1979 and are at high risk for mumps infection should consider immunization with 2 doses of   MMR vaccine. For females of childbearing age, rubella immunity should be determined. If there is no evidence of immunity, females who are not pregnant should be vaccinated. If there is no evidence of immunity, females who are pregnant should delay immunization until after pregnancy.  Unvaccinated health care workers born before [birth year] who lack laboratory evidence of measles, mumps, or rubella immunity or laboratory confirmation of disease should consider measles and mumps immunization with 2 doses of MMR vaccine or rubella immunization with 1 dose of MMR vaccine.  Pneumococcal 13-valent conjugate (PCV13) vaccine. When indicated, a person who is uncertain of her immunization history and has no record of immunization should receive the PCV13 vaccine. An adult aged 54 years or older who has certain medical conditions and has not been previously immunized should receive 1 dose of PCV13 vaccine. This PCV13 should be followed with a dose of pneumococcal polysaccharide (PPSV23) vaccine. The PPSV23 vaccine dose should be obtained at least 8 weeks after the dose of PCV13 vaccine. An adult aged 58 years or older who has certain medical conditions and previously received 1 or more doses of PPSV23 vaccine should receive 1 dose of PCV13. The PCV13 vaccine dose should be obtained 1 or more years after the last PPSV23 vaccine dose.  Pneumococcal polysaccharide (PPSV23) vaccine. When PCV13 is also indicated, PCV13 should be obtained first. All adults aged 58 years and older should be immunized. An adult younger than age 65 years who has certain medical conditions should be immunized. Any person who resides in a nursing home or long-term care facility should be immunized. An adult smoker should be immunized. People with an immunocompromised condition and certain other conditions should receive both PCV13 and PPSV23 vaccines. People with human immunodeficiency virus (HIV) infection should be immunized as soon as possible after diagnosis. Immunization during chemotherapy or radiation therapy should be avoided. Routine use of PPSV23 vaccine is not recommended for American Indians, [place] Natives, or people younger than 65 years unless there are medical conditions that require PPSV23 vaccine. When indicated,  people who have unknown immunization and have no record of immunization should receive PPSV23 vaccine. One-time revaccination 5 years after the first dose of PPSV23 is recommended for people aged 70 64 years who have chronic kidney failure, nephrotic syndrome, asplenia, or immunocompromised conditions. People who received 1 2 doses of PPSV23 before age 32 years should receive another dose of PPSV23 vaccine at age 96 years or later if at least 5 years have passed since the previous dose. Doses of PPSV23 are not needed for people immunized with PPSV23 at or after age 55 years.  Meningococcal vaccine. Adults with asplenia or persistent complement component deficiencies should receive 2 doses of quadrivalent meningococcal conjugate (MenACWY-D) vaccine. The doses should be obtained at least 2 months apart. Microbiologists working with certain meningococcal bacteria, [employer] recruits, people at risk during an outbreak, and people who travel to or live in countries with a high rate of meningitis should be immunized. A first-year college student up through age 58 years who is living in a residence hall should receive a dose if she did not receive a dose on or after her 16th birthday. Adults who have certain high-risk conditions should receive one or more doses of vaccine.  Hepatitis A vaccine. Adults who wish to be protected from this disease, have certain high-risk conditions, work with hepatitis A-infected animals, work in hepatitis A research labs, or travel to or work in countries with a high rate of hepatitis A should be  immunized. Adults who were previously unvaccinated and who anticipate close contact with an international adoptee during the first 60 days after arrival in the [place] from a country with a high rate of hepatitis A should be immunized.  Hepatitis B vaccine.  Adults who wish to be protected from this disease, have certain high-risk conditions, may be exposed to blood or other infectious  body fluids, are household contacts or sex partners of hepatitis B positive people, are clients or workers in certain care facilities, or travel to or work in countries with a high rate of hepatitis B should be immunized.  Haemophilus influenzae type b (Hib) vaccine. A previously unvaccinated person with asplenia or sickle cell disease or having a scheduled splenectomy should receive 1 dose of Hib vaccine. Regardless of previous immunization, a recipient of a hematopoietic stem cell transplant should receive a 3-dose series 6 12 months after her successful transplant. Hib vaccine is not recommended for adults with HIV infection.  Preventive Services / Frequency Ages 6 to 39years  Blood pressure check.** / Every 1 to 2 years.  Lipid and cholesterol check.** / Every 5 years beginning at age 39.  Clinical breast exam.** / Every 3 years for women in their 61s and 62s.  BRCA-related cancer risk assessment.** / For women who have family members with a BRCA-related cancer (breast, ovarian, tubal, or peritoneal cancers).  Pap test.** / Every 2 years from ages 47 through 85. Every 3 years starting at age 34 through age 12 or 74 with a history of 3 consecutive normal Pap tests.  HPV screening.** / Every 3 years from ages 46 through ages 43 to 54 with a history of 3 consecutive normal Pap tests.  Hepatitis C blood test.** / For any individual with known risks for hepatitis C.  Skin self-exam. / Monthly.  Influenza vaccine. / Every year.  Tetanus, diphtheria, and acellular pertussis (Tdap, Td) vaccine.** / Consult your health care provider. Pregnant women should receive 1 dose of Tdap vaccine during each pregnancy. 1 dose of Td every 10 years.  Varicella vaccine.** / Consult your health care provider. Pregnant females who do not have evidence of immunity should receive the first dose after pregnancy.  HPV vaccine. / 3 doses over 6 months, if 64 and younger. The vaccine is not recommended for use in  pregnant females. However, pregnancy testing is not needed before receiving a dose.  Measles, mumps, rubella (MMR) vaccine.** / You need at least 1 dose of MMR if you were born in 1957 or later. You may also need a 2nd dose. For females of childbearing age, rubella immunity should be determined. If there is no evidence of immunity, females who are not pregnant should be vaccinated. If there is no evidence of immunity, females who are pregnant should delay immunization until after pregnancy.  Pneumococcal 13-valent conjugate (PCV13) vaccine.** / Consult your health care provider.  Pneumococcal polysaccharide (PPSV23) vaccine.** / 1 to 2 doses if you smoke cigarettes or if you have certain conditions.  Meningococcal vaccine.** / 1 dose if you are age 71 to 37 years and a Market researcher living in a residence hall, or have one of several medical conditions, you need to get vaccinated against meningococcal disease. You may also need additional booster doses.  Hepatitis A vaccine.** / Consult your health care provider.  Hepatitis B vaccine.** / Consult your health care provider.  Haemophilus influenzae type b (Hib) vaccine.** / Consult your health care provider.  Ages 55 to 64years  Blood pressure check.** / Every 1 to 2 years.  Lipid and cholesterol check.** / Every 5 years beginning at age 20 years.  Lung cancer screening. / Every year if you are aged 55 80 years and have a 30-pack-year history of smoking and currently smoke or have quit within the past 15 years. Yearly screening is stopped once you have quit smoking for at least 15 years or develop a health problem that would prevent you from having lung cancer treatment.  Clinical breast exam.** / Every year after age 40 years.  BRCA-related cancer risk assessment.** / For women who have family members with a BRCA-related cancer (breast, ovarian, tubal, or peritoneal cancers).  Mammogram.** / Every year beginning at age 40  years and continuing for as long as you are in good health. Consult with your health care provider.  Pap test.** / Every 3 years starting at age 30 years through age 65 or 70 years with a history of 3 consecutive normal Pap tests.  HPV screening.** / Every 3 years from ages 30 years through ages 65 to 70 years with a history of 3 consecutive normal Pap tests.  Fecal occult blood test (FOBT) of stool. / Every year beginning at age 50 years and continuing until age 75 years. You may not need to do this test if you get a colonoscopy every 10 years.  Flexible sigmoidoscopy or colonoscopy.** / Every 5 years for a flexible sigmoidoscopy or every 10 years for a colonoscopy beginning at age 50 years and continuing until age 75 years.  Hepatitis C blood test.** / For all people born from 1945 through 1965 and any individual with known risks for hepatitis C.  Skin self-exam. / Monthly.  Influenza vaccine. / Every year.  Tetanus, diphtheria, and acellular pertussis (Tdap/Td) vaccine.** / Consult your health care provider. Pregnant women should receive 1 dose of Tdap vaccine during each pregnancy. 1 dose of Td every 10 years.  Varicella vaccine.** / Consult your health care provider. Pregnant females who do not have evidence of immunity should receive the first dose after pregnancy.  Zoster vaccine.** / 1 dose for adults aged 60 years or older.  Measles, mumps, rubella (MMR) vaccine.** / You need at least 1 dose of MMR if you were born in 1957 or later. You may also need a 2nd dose. For females of childbearing age, rubella immunity should be determined. If there is no evidence of immunity, females who are not pregnant should be vaccinated. If there is no evidence of immunity, females who are pregnant should delay immunization until after pregnancy.  Pneumococcal 13-valent conjugate (PCV13) vaccine.** / Consult your health care provider.  Pneumococcal polysaccharide (PPSV23) vaccine.** / 1 to 2 doses if  you smoke cigarettes or if you have certain conditions.  Meningococcal vaccine.** / Consult your health care provider.  Hepatitis A vaccine.** / Consult your health care provider.  Hepatitis B vaccine.** / Consult your health care provider.  Haemophilus influenzae type b (Hib) vaccine.** / Consult your health care provider.  Ages 65 years and over  Blood pressure check.** / Every 1 to 2 years.  Lipid and cholesterol check.** / Every 5 years beginning at age 20 years.  Lung cancer screening. / Every year if you are aged 55 80 years and have a 30-pack-year history of smoking and currently smoke or have quit within the past 15 years. Yearly screening is stopped once you have quit smoking for at least 15 years or develop a health problem that   would prevent you from having lung cancer treatment.  Clinical breast exam.** / Every year after age 103 years.  BRCA-related cancer risk assessment.** / For women who have family members with a BRCA-related cancer (breast, ovarian, tubal, or peritoneal cancers).  Mammogram.** / Every year beginning at age 36 years and continuing for as long as you are in good health. Consult with your health care provider.  Pap test.** / Every 3 years starting at age 5 years through age 85 or 10 years with 3 consecutive normal Pap tests. Testing can be stopped between 65 and 70 years with 3 consecutive normal Pap tests and no abnormal Pap or HPV tests in the past 10 years.  HPV screening.** / Every 3 years from ages 93 years through ages 70 or 45 years with a history of 3 consecutive normal Pap tests. Testing can be stopped between 65 and 70 years with 3 consecutive normal Pap tests and no abnormal Pap or HPV tests in the past 10 years.  Fecal occult blood test (FOBT) of stool. / Every year beginning at age 8 years and continuing until age 45 years. You may not need to do this test if you get a colonoscopy every 10 years.  Flexible sigmoidoscopy or colonoscopy.** /  Every 5 years for a flexible sigmoidoscopy or every 10 years for a colonoscopy beginning at age 69 years and continuing until age 68 years.  Hepatitis C blood test.** / For all people born from [birth year] through 1967 and any individual with known risks for hepatitis C.  Osteoporosis screening.** / A one-time screening for women ages 7 years and over and women at risk for fractures or osteoporosis.  Skin self-exam. / Monthly.  Influenza vaccine. / Every year.  Tetanus, diphtheria, and acellular pertussis (Tdap/Td) vaccine.** / 1 dose of Td every 10 years.  Varicella vaccine.** / Consult your health care provider.  Zoster vaccine.** / 1 dose for adults aged 5 years or older.  Pneumococcal 13-valent conjugate (PCV13) vaccine.** / Consult your health care provider.  Pneumococcal polysaccharide (PPSV23) vaccine.** / 1 dose for all adults aged 74 years and older.  Meningococcal vaccine.** / Consult your health care provider.  Hepatitis A vaccine.** / Consult your health care provider.  Hepatitis B vaccine.** / Consult your health care provider.  Haemophilus influenzae type b (Hib) vaccine.** / Consult your health care provider. ** Family history and personal history of risk and conditions may change your health care provider's recommendations. Document Released: 11/13/2001 Document Revised: 07/08/2013  [facility] Patient Information 2014 [place], [place].   EXERCISE AND DIET:  We recommended that you start or continue a regular exercise program for good health. Regular exercise means any activity that makes your heart beat faster and makes you sweat.  We recommend exercising at least 30 minutes per day at least 3 days a week, preferably 5.  We also recommend a diet low in fat and sugar / carbohydrates.  Inactivity, poor dietary choices and obesity can cause diabetes, heart attack, stroke, and kidney damage, among others.     ALCOHOL AND SMOKING:  Women should limit their alcohol intake to no  more than 7 drinks/beers/glasses of wine (combined, not each!) per week. Moderation of alcohol intake to this level decreases your risk of breast cancer and liver damage.  ( And of course, no recreational drugs are part of a healthy lifestyle.)  Also, you should not be smoking at all or even being exposed to second hand smoke. Most people know smoking can  cause cancer, and various heart and lung diseases, but did you know it also contributes to weakening of your bones?  Aging of your skin?  Yellowing of your teeth and nails?   CALCIUM AND VITAMIN D:  Adequate intake of calcium and Vitamin D are recommended.  The recommendations for exact amounts of these supplements seem to change often, but generally speaking 600 mg of calcium (either carbonate or citrate) and 800 units of Vitamin D per day seems prudent. Certain women may benefit from higher intake of Vitamin D.  If you are among these women, your doctor will have told you during your visit.     PAP SMEARS:  Pap smears, to check for cervical cancer or precancers,  have traditionally been done yearly, although recent scientific advances have shown that most women can have pap smears less often.  However, every woman still should have a physical exam from her gynecologist or primary care physician every year. It will include a breast check, inspection of the vulva and vagina to check for abnormal growths or skin changes, a visual exam of the cervix, and then an exam to evaluate the size and shape of the uterus and ovaries.  And after 40 years of age, a rectal exam is indicated to check for rectal cancers. We will also provide age appropriate advice regarding health maintenance, like when you should have certain vaccines, screening for sexually transmitted diseases, bone density testing, colonoscopy, mammograms, etc.    MAMMOGRAMS:  All women over 59 years old should have a yearly mammogram. Many facilities now offer a "3D" mammogram, which may cost  around $50 extra out of pocket. If possible,  we recommend you accept the option to have the 3D mammogram performed.  It both reduces the number of women who will be called back for extra views which then turn out to be normal, and it is better than the routine mammogram at detecting truly abnormal areas.     COLONOSCOPY:  Colonoscopy to screen for colon cancer is recommended for all women at age 62.  We know, you hate the idea of the prep.  We agree, BUT, having colon cancer and not knowing it is worse!!  Colon cancer so often starts as a polyp that can be seen and removed at colonscopy, which can quite literally save your life!  And if your first colonoscopy is normal and you have no family history of colon cancer, most women don't have to have it again for 10 years.  Once every ten years, you can do something that may end up saving your life, right?  We will be happy to help you get it scheduled when you are ready.  Be sure to check your insurance coverage so you understand how much it will cost.  It may be covered as a preventative service at no cost, but you should check your particular policy.   Overall you are doing a GREAT JOB taking care of yourself! Recent lab work is stable. Continue to drink plenty of water, eat a heart healthy diet, and continue regular exercise. If you ever develop yeast infection symptoms, please call clinic and we will send in [place] rx. Recommend annual physical with fasting labs. GREAT TO SEE YOU!

## 2018-12-28 ENCOUNTER — Encounter (HOSPITAL_COMMUNITY): Payer: Self-pay

## 2018-12-28 ENCOUNTER — Ambulatory Visit (HOSPITAL_COMMUNITY)
Admission: EM | Admit: 2018-12-28 | Discharge: 2018-12-28 | Disposition: A | Discharge status: Home / Self Care | Payer: 59 | Attending: Family Medicine | Admitting: Family Medicine

## 2018-12-28 ENCOUNTER — Other Ambulatory Visit: Payer: Self-pay

## 2018-12-28 DIAGNOSIS — M542 Cervicalgia: Secondary | ICD-10-CM | POA: Diagnosis not present

## 2018-12-28 DIAGNOSIS — G43909 Migraine, unspecified, not intractable, without status migrainosus: Secondary | ICD-10-CM

## 2018-12-28 MED ORDER — DEXAMETHASONE SODIUM PHOSPHATE 10 MG/ML IJ SOLN
INTRAMUSCULAR | Status: AC
  Filled 2018-12-28: qty 1

## 2018-12-28 MED ORDER — KETOROLAC TROMETHAMINE 60 MG/2ML IM SOLN
INTRAMUSCULAR | Status: AC
  Filled 2018-12-28: qty 2

## 2018-12-28 MED ORDER — METOCLOPRAMIDE HCL 5 MG/ML IJ SOLN
5.0000 mg | Freq: Once | INTRAMUSCULAR | Status: AC
  Administered 2018-12-28: 5 mg via INTRAMUSCULAR

## 2018-12-28 MED ORDER — ONDANSETRON 4 MG PO TBDP: 4.0000 mg | ORAL_TABLET | Freq: Three times a day (TID) | ORAL | 0 refills | Status: DC | PRN

## 2018-12-28 MED ORDER — KETOROLAC TROMETHAMINE 60 MG/2ML IM SOLN
60.0000 mg | Freq: Once | INTRAMUSCULAR | Status: AC
  Administered 2018-12-28: 60 mg via INTRAMUSCULAR

## 2018-12-28 MED ORDER — DEXAMETHASONE SODIUM PHOSPHATE 10 MG/ML IJ SOLN
10.0000 mg | Freq: Once | INTRAMUSCULAR | Status: AC
  Administered 2018-12-28: 10 mg via INTRAMUSCULAR

## 2018-12-28 MED ORDER — METOCLOPRAMIDE HCL 5 MG/ML IJ SOLN
INTRAMUSCULAR | Status: AC
  Filled 2018-12-28: qty 2

## 2018-12-28 MED ORDER — NAPROXEN 500 MG PO TABS: 500.0000 mg | ORAL_TABLET | Freq: Two times a day (BID) | ORAL | 0 refills | Status: AC

## 2018-12-28 MED ORDER — CYCLOBENZAPRINE HCL 5 MG PO TABS: 5.0000 mg | ORAL_TABLET | Freq: Two times a day (BID) | ORAL | 0 refills | Status: DC | PRN

## 2018-12-28 NOTE — Discharge Instructions (Addendum)
We gave you an injection of Toradol, Decadron and Reglan today to help with your headache.  These usually begin working in approximately 40 minutes.  Please use Naprosyn twice daily with food for further management of headache/neck pain You may use flexeril as needed to help with pain. This is a muscle relaxer and causes sedation- please use only at bedtime or when you will be home and not have to drive/work-begin with 1 tablet, may increase to 2  Use Zofran as needed for nausea and vomiting  Please follow-up in the emergency room if headache persisting, not responding to injections, developing worsening vision changes, dizziness, lightheadedness, persistent nausea and vomiting, worsening headache/pain

## 2018-12-28 NOTE — ED Provider Notes (Addendum)
[facility]    CSN: [id number] Arrival date & time: 12/28/18  1048     History   Chief Complaint Chief Complaint  Patient presents with   Neck Pain   Headache    HPI Andrea Clark is a 38 y.o. female no contributing past medical history presenting today for evaluation of neck pain and headache.  Patient states that 2 weeks ago she accidentally choked on a piece of steak, and attempting to get this piece of steak up she believes she strained her neck.  Since she has had headaches off and on for the past 2 weeks.  Beginning Thursday, 4 days ago her headache has stayed and persisted.  She has had associated nausea and vomiting.  2 episodes of vomiting today.  She has had associated photophobia.  Denies dizziness or lightheadedness.  She has had some occasional blurriness, but also notes that her eyes have been itchy and more watery related to allergies.  She denies other associated URI symptoms of congestion, cough or sore throat.  Denies any recent travel.  She has tried taking ibuprofen, Tylenol, Goody's, Aleve, Tylenol PM as well as ice and heat and gentle neck stretching without relief.  She notices worsening discomfort when turning her head leftward, but notes most of her headache is in the right side of her upper neck.  States that she does typically get 1 headache a month in relation to her cycle, but this is worse than normal.  HPI  History reviewed. No pertinent past medical history.  Patient Active Problem List   Diagnosis Date Noted   Healthcare maintenance 09/18/2018   Chronic idiopathic constipation 09/18/2018   ANXIETY 11/29/2006   DEPRESSION 11/29/2006    Past Surgical History:  Procedure Laterality Date   BREAST SURGERY     augmentation   WISDOM TOOTH EXTRACTION      OB History   No obstetric history on file.      Home Medications    Prior to Admission medications   Medication Sig Start Date End Date Taking? Authorizing Provider    cyclobenzaprine (FLEXERIL) 5 MG tablet Take 1-2 tablets (5-10 mg total) by mouth 2 (two) times daily as needed for muscle spasms. 12/28/18   Natalee Tomkiewicz C, PA-C  levonorgestrel (MIRENA) 20 MCG/24HR IUD 1 each by Intrauterine route once.    [provider]  naproxen (NAPROSYN) 500 MG tablet Take 1 tablet (500 mg total) by mouth 2 (two) times daily. 12/28/18   Hal Norrington C, PA-C  ondansetron (ZOFRAN ODT) 4 MG disintegrating tablet Take 1 tablet (4 mg total) by mouth every 8 (eight) hours as needed for nausea or vomiting. 12/28/18   Demondre Aguas, Junius Creamer, PA-C    Family History Family History  Problem Relation Age of Onset   Depression Mother    Alcohol abuse Father    Hypertension Father     Social History Social History   Tobacco Use   Smoking status: Never Smoker   Smokeless tobacco: Never Used  Substance Use Topics   Alcohol use: Never    Frequency: Never   Drug use: Never     Allergies   Patient has no known allergies.   Review of Systems Review of Systems  Constitutional: Negative for fatigue and fever.  HENT: Negative for congestion, sinus pressure and sore throat.   Eyes: Positive for photophobia. Negative for pain and visual disturbance.  Respiratory: Negative for cough and shortness of breath.   Cardiovascular: Negative for chest pain.  Gastrointestinal: Positive for nausea and vomiting. Negative for abdominal pain.  Genitourinary: Negative for decreased urine volume and hematuria.  Musculoskeletal: Positive for myalgias and neck pain. Negative for neck stiffness.  Neurological: Positive for headaches. Negative for dizziness, syncope, facial asymmetry, speech difficulty, weakness, light-headedness and numbness.     Physical Exam Triage Vital Signs ED Triage Vitals  Enc Vitals Group     BP 12/28/18 1104 123/88     Pulse Rate 12/28/18 1104 68     Resp 12/28/18 1104 16     Temp 12/28/18 1104 97.9 F (36.6 C)     Temp Source 12/28/18 1104  Oral     SpO2 12/28/18 1104 97 %     Weight --      Height --      Head Circumference --      Peak Flow --      Pain Score 12/28/18 1106 10     Pain Loc --      Pain Edu? --      Excl. in GC? --    No data found.  Updated Vital Signs BP 123/88 (BP Location: Left Arm)    Pulse 68    Temp 97.9 F (36.6 C) (Oral)    Resp 16    SpO2 97%   Visual Acuity Right Eye Distance:   Left Eye Distance:   Bilateral Distance:    Right Eye Near:   Left Eye Near:    Bilateral Near:     Physical Exam Vitals signs and nursing note reviewed.  Constitutional:      General: She is not in acute distress.    Appearance: She is well-developed.  HENT:     Head: Normocephalic and atraumatic.     Ears:     Comments: Bilateral ears without tenderness to palpation of external auricle, tragus and mastoid, EAC's without erythema or swelling, TM's with good bony landmarks and cone of light. Non erythematous.    Mouth/Throat:     Comments: Oral mucosa pink and moist, no tonsillar enlargement or exudate. Posterior pharynx patent and nonerythematous, no uvula deviation or swelling. Normal phonation. Palate elevating symmetrically Eyes:     Extraocular Movements: Extraocular movements intact.     Conjunctiva/sclera: Conjunctivae normal.     Pupils: Pupils are equal, round, and reactive to light.  Neck:     Musculoskeletal: Neck supple.     Comments: Nontender to palpation of cervical spine midline, increased tenderness to right upper trapezius/para cervical musculature extending into occipital region  Full active range of motion of the neck although pain triggered with more leftward rotation Cardiovascular:     Rate and Rhythm: Normal rate and regular rhythm.     Heart sounds: No murmur.  Pulmonary:     Effort: Pulmonary effort is normal. No respiratory distress.     Breath sounds: Normal breath sounds.     Comments: Breathing comfortably at rest, CTABL, no wheezing, rales or other adventitious sounds  auscultated Abdominal:     Palpations: Abdomen is soft.     Tenderness: There is no abdominal tenderness.  Skin:    General: Skin is warm and dry.  Neurological:     General: No focal deficit present.     Mental Status: She is alert and oriented to person, place, and time. Mental status is at baseline.     Cranial Nerves: No cranial nerve deficit.     Motor: No weakness.     Comments: Patient A&O x3, cranial nerves  II-XII grossly intact, strength at shoulders, hips and knees 5/5, equal bilaterally, patellar reflex 2+ bilaterally. Gait without abnormality.      UC Treatments / Results  Labs (all labs ordered are listed, but only abnormal results are displayed) Labs Reviewed - No data to display  EKG None  Radiology No results found.  Procedures Procedures (including critical care time)  Medications Ordered in UC Medications  ketorolac (TORADOL) injection 60 mg (60 mg Intramuscular Given 12/28/18 1137)  metoCLOPramide (REGLAN) injection 5 mg (5 mg Intramuscular Given 12/28/18 1138)  dexamethasone (DECADRON) injection 10 mg (10 mg Intramuscular Given 12/28/18 1138)    Initial Impression / Assessment and Plan / UC Course  I have reviewed the triage vital signs and the nursing notes.  Pertinent labs & imaging results that were available during my care of the patient were reviewed by me and considered in my medical decision making (see chart for details).     Patient with headache, no neuro deficits, vital signs stable, no facial drooping, most likely more related to cervical strain/neck pain exacerbating headaches.  Given symptoms progressive over the past 2 weeks, did discuss with patient that cannot rule out more serious causes of neck pain.  Opted for trial with injection of Toradol, Decadron and Reglan.  Will send home with Naprosyn and Zofran.  Based off exam and patient presentation feel intracranial emergency less likely at this time, but advised if injections not helping,  symptoms worsening, persisting to go to emergency room for further evaluation to r/o intracranial cause, dissection, etc as cause of persistent symptoms.   Discussed strict return precautions. Patient verbalized understanding and is agreeable with plan.  Final Clinical Impressions(s) / UC Diagnoses   Final diagnoses:  Neck pain  Migraine without status migrainosus, not intractable, unspecified migraine type     Discharge Instructions     We gave you an injection of Toradol, Decadron and Reglan today to help with your headache.  These usually begin working in approximately 40 minutes.  Please use Naprosyn twice daily with food for further management of headache/neck pain You may use flexeril as needed to help with pain. This is a muscle relaxer and causes sedation- please use only at bedtime or when you will be home and not have to drive/work-begin with 1 tablet, may increase to 2  Use Zofran as needed for nausea and vomiting  Please follow-up in the emergency room if headache persisting, not responding to injections, developing worsening vision changes, dizziness, lightheadedness, persistent nausea and vomiting, worsening headache/pain   ED Prescriptions    Medication Sig Dispense Auth. Provider   naproxen (NAPROSYN) 500 MG tablet Take 1 tablet (500 mg total) by mouth 2 (two) times daily. 30 tablet Kayron Hicklin C, PA-C   ondansetron (ZOFRAN ODT) 4 MG disintegrating tablet Take 1 tablet (4 mg total) by mouth every 8 (eight) hours as needed for nausea or vomiting. 20 tablet Lashann Hagg C, PA-C   cyclobenzaprine (FLEXERIL) 5 MG tablet Take 1-2 tablets (5-10 mg total) by mouth 2 (two) times daily as needed for muscle spasms. 24 tablet Julio Zappia, [place] C, PA-C     Controlled Substance Prescriptions [place] Controlled Substance Registry consulted? Not Applicable   Lew Dawes, PA-C 12/28/18 [address], [place], [place] 12/28/18 1152

## 2018-12-28 NOTE — ED Triage Notes (Signed)
Pt presents to [facility] for headache and neck pain x2 weeks. Pt also complains of nausea, vomiting, and photo-sensitivity. Pt has applied ice and heat to affected areas as well as OTC medications but has no relief

## 2018-12-29 ENCOUNTER — Encounter: Payer: Self-pay | Admitting: Adult Health

## 2018-12-29 ENCOUNTER — Other Ambulatory Visit: Payer: Self-pay | Admitting: Adult Health

## 2018-12-29 DIAGNOSIS — M542 Cervicalgia: Secondary | ICD-10-CM

## 2018-12-29 MED ORDER — FLUTICASONE PROPIONATE 50 MCG/ACT NA SUSP: 2.0000 | Freq: Every day | NASAL | 2 refills | Status: AC

## 2018-12-29 MED ORDER — PLECANATIDE 3 MG PO TABS: 1.0000 | ORAL_TABLET | Freq: Every day | ORAL | 3 refills | Status: DC

## 2018-12-30 ENCOUNTER — Ambulatory Visit (INDEPENDENT_AMBULATORY_CARE_PROVIDER_SITE_OTHER): Payer: 59 | Admitting: Family Medicine

## 2018-12-30 ENCOUNTER — Encounter (INDEPENDENT_AMBULATORY_CARE_PROVIDER_SITE_OTHER): Payer: Self-pay | Admitting: Family Medicine

## 2018-12-30 ENCOUNTER — Other Ambulatory Visit: Payer: Self-pay

## 2018-12-30 DIAGNOSIS — M542 Cervicalgia: Secondary | ICD-10-CM | POA: Diagnosis not present

## 2018-12-30 MED ORDER — NABUMETONE 500 MG PO TABS: 500.0000 mg | ORAL_TABLET | Freq: Two times a day (BID) | ORAL | 3 refills | Status: DC | PRN

## 2018-12-30 MED ORDER — TIZANIDINE HCL 2 MG PO TABS: 2.0000 mg | ORAL_TABLET | Freq: Four times a day (QID) | ORAL | 1 refills | Status: DC | PRN

## 2018-12-30 NOTE — Progress Notes (Signed)
   Office Visit Note   Patient: Andrea Clark           Date of Birth: 05-Jun-1981           MRN: [id number] Visit Date: 12/30/2018 Requested by: Julaine Fusi, NP [address] [place], [place] [address] PCP: Julaine Fusi, NP  Subjective: Chief Complaint  Patient presents with  . Neck - Pain    Pain since 12/13/2018, since choking on a piece of steak. Has developed headaches since. Hurts to turn head to the right or down.    HPI: She is here with right-sided neck pain.  On March 14 she choked on a piece of meat.  She was coughing and gagging, and eventually it passed on its own but she immediately had pain in her neck.  Over the next few days pain got worse and she started having severe headaches.  She gets occasional pain into her right arm.  She is right-hand dominant, she has not noticed any numbness or weakness in her arm.  She is never had problems with her neck before but she has had a history of headaches occasionally.  The ER recently she was given Flexeril and naproxen but she has not noticed much improvement.              ROS: Denies fevers or chills or respiratory symptoms.  All other systems were reviewed and are negative.  Objective: Vital Signs: There were no vitals taken for this visit.  Physical Exam:  General:  Alert and oriented, in no acute distress. Pulm:  Breathing unlabored. Psy:  Normal mood, congruent affect. Skin: No rash on her skin. Neck: She has slightly decreased rotation to the right with pain, Spurling's test is equivocal.  She is very tender in the upper right cervical paraspinous muscles.  She has a tender trigger point in the right rhomboid area.  Upper extremity strength and reflexes are normal except for biceps flexion which is slightly weak at 4+/5 compared to the left.  DTRs are 2+ bilaterally.  Imaging: None today.  Assessment & Plan: 1.  Right-sided neck pain with subtle biceps weakness, concerning for cervical disc protrusion -We  will try a different muscle relaxant and anti-inflammatory, she will do some postural exercises at home.  If symptoms worsen we will try to refer her to physical therapy.  If still no improvement we will proceed with x-rays and MRI scan.     Procedures: No procedures performed  No notes on file     PMFS History: Patient Active Problem List   Diagnosis Date Noted  . Healthcare maintenance 09/18/2018  . Chronic idiopathic constipation 09/18/2018  . ANXIETY 11/29/2006  . DEPRESSION 11/29/2006   History reviewed. No pertinent past medical history.  Family History  Problem Relation Age of Onset  . Depression Mother   . Alcohol abuse Father   . Hypertension Father     Past Surgical History:  Procedure Laterality Date  . BREAST SURGERY     augmentation  . WISDOM TOOTH EXTRACTION     Social History   Occupational History  . Not on file  Tobacco Use  . Smoking status: Never Smoker  . Smokeless tobacco: Never Used  Substance and Sexual Activity  . Alcohol use: Never    Frequency: Never  . Drug use: Never  . Sexual activity: Yes    Birth control/protection: I.U.D.

## 2018-12-31 ENCOUNTER — Ambulatory Visit (INDEPENDENT_AMBULATORY_CARE_PROVIDER_SITE_OTHER): Payer: Self-pay | Admitting: Specialist

## 2019-01-07 ENCOUNTER — Encounter (INDEPENDENT_AMBULATORY_CARE_PROVIDER_SITE_OTHER): Payer: Self-pay | Admitting: Family Medicine

## 2019-01-07 DIAGNOSIS — M542 Cervicalgia: Secondary | ICD-10-CM

## 2019-01-07 MED ORDER — PREDNISONE 10 MG PO TABS: ORAL_TABLET | ORAL | 0 refills | Status: DC

## 2019-01-07 NOTE — Addendum Note (Signed)
Addended by: Lillia Carmel on: 01/07/2019 12:49 PM   Modules accepted: Orders

## 2019-01-28 ENCOUNTER — Encounter: Payer: Self-pay | Admitting: Adult Health

## 2019-06-04 ENCOUNTER — Encounter: Payer: Self-pay | Admitting: Adult Health

## 2019-06-09 ENCOUNTER — Other Ambulatory Visit: Payer: Self-pay | Admitting: Adult Health

## 2019-07-07 ENCOUNTER — Telehealth: Payer: Self-pay | Admitting: Adult Health

## 2019-07-07 ENCOUNTER — Encounter: Payer: Self-pay | Admitting: Adult Health

## 2019-07-07 ENCOUNTER — Ambulatory Visit (INDEPENDENT_AMBULATORY_CARE_PROVIDER_SITE_OTHER): Payer: 59 | Admitting: Adult Health

## 2019-07-07 ENCOUNTER — Other Ambulatory Visit: Payer: Self-pay

## 2019-07-07 DIAGNOSIS — J310 Chronic rhinitis: Secondary | ICD-10-CM

## 2019-07-07 DIAGNOSIS — J329 Chronic sinusitis, unspecified: Secondary | ICD-10-CM | POA: Insufficient documentation

## 2019-07-07 NOTE — Progress Notes (Signed)
Virtual Visit via Video Note  I connected with Andrea Clark on 07/07/19 at  2:15 PM EDT by a video enabled telemedicine application and verified that I am speaking with the correct person using two identifiers.  Location: Patient: Home Provider: In Clinic   I discussed the limitations of evaluation and management by telemedicine and the availability of in person appointments. The patient expressed understanding and agreed to proceed.  History of Present Illness: Andrea Clark is using WebEx today for complaints of clear nasal drainage, post nasal gtt that will cause a nocturnal non-productive cough, facial pressure, and sinus HA that will move from behind OD and OS- sx's started >72 hrs ago. She denies fever/sore throat/loss of sense of smell or taste. She denies N/V/D/C She reports "muffled hearing" in both ears and dull ache in L ear. She reports seasonal allergies especially with onset of fall/winter. She been using OTC Aleve D with only minimal sx relief. She denies need for refill on Fluticasone. She states "I cannot use nasal rinses". She denies known exposure to SARS-CoV-2.  Due to sx's- recommend that she be tested for  SARS-CoV-2. She vehemently declined and states "If I get a false positive and my kids can't go back to school, I will loss my mind". She then followed it up with "[name] Had >6K false positives, if they don't know how to test, then I don't trust that you know how to test". Discussed at length [facility] policies in regards to seeing acute visits and that we cannot bring her in until she has negative SARS-CoV-2 test. Explained that if she still has sx's for another 7 days (total of 10 days) then can send in ABX to treat for rhinosinusitis- she just needs to send a MyChart message- also reminded pt that with neg  SARS-CoV-2 test we can she her in clinic. She began to raise her voice and exclaimed "so are you telling me that I need to find a new primary  care?" Again, explained the current acute illness policies and encouraged her to go for  SARS-CoV-2 testing- she gain declined. She then exclaimed "I hope that you don't charge me for this since NOW I HAVE TO GO TO AN URGENT CARE". Then ended the [facility]  She then     Patient Care Team    Relationship Specialty Notifications Start End  [name], NP PCP - General Family Medicine  09/18/18   [name], MD Consulting Physician Obstetrics and Gynecology  09/18/18   [name]  Optometry  09/18/18     Patient Active Problem List   Diagnosis Date Noted  . Healthcare maintenance 09/18/2018  . Chronic idiopathic constipation 09/18/2018  . ANXIETY 11/29/2006  . DEPRESSION 11/29/2006     No past medical history on file.   Past Surgical History:  Procedure Laterality Date  . BREAST SURGERY     augmentation  . WISDOM TOOTH EXTRACTION       Family History  Problem Relation Age of Onset  . Depression Mother   . Alcohol abuse Father   . Hypertension Father      Social History   Substance and Sexual Activity  Drug Use Never     Social History   Substance and Sexual Activity  Alcohol Use Never  . Frequency: Never     Social History   Tobacco Use  Smoking Status Never Smoker  Smokeless Tobacco Never Used     Outpatient Encounter Medications as of 07/07/2019  Medication Sig Note  . fluticasone (FLONASE) 50 MCG/ACT nasal spray Place 2 sprays into both nostrils daily.   Marland Kitchen levonorgestrel (MIRENA) 20 MCG/24HR IUD 1 each by Intrauterine route once.   . naproxen (NAPROSYN) 500 MG tablet Take 1 tablet (500 mg total) by mouth 2 (two) times daily.   . [DISCONTINUED] cyclobenzaprine (FLEXERIL) 5 MG tablet Take 1-2 tablets (5-10 mg total) by mouth 2 (two) times daily as needed for muscle spasms.   . [DISCONTINUED] nabumetone (RELAFEN) 500 MG tablet Take 1 tablet (500 mg total) by mouth 2 (two) times daily as needed.   . [DISCONTINUED] ondansetron (ZOFRAN ODT) 4  MG disintegrating tablet Take 1 tablet (4 mg total) by mouth every 8 (eight) hours as needed for nausea or vomiting.   . [DISCONTINUED] Plecanatide (TRULANCE) 3 MG TABS Take 1 tablet by mouth daily. 12/30/2018: Not filled this yet.  . [DISCONTINUED] predniSONE (DELTASONE) 10 MG tablet Take as directed for 12 days.  Daily dose 6,6,5,5,4,4,3,3,2,2,1,1.   . [DISCONTINUED] tiZANidine (ZANAFLEX) 2 MG tablet Take 1-2 tablets (2-4 mg total) by mouth every 6 (six) hours as needed for muscle spasms.    No facility-administered encounter medications on file as of 07/07/2019.     Allergies: Patient has no known allergies.  There is no height or weight on file to calculate BMI.  Temperature (!) 96.7 F (35.9 C), temperature source Oral. Review of Systems: General:   Denies fever, chills, unexplained weight loss.  Optho/Auditory:   Denies visual changes, blurred vision/LOV ENT: Nasal Drainage+ Left Otalgia + Bil Decreased Hearing + Respiratory:   Denies SOB, DOE more than baseline levels Cough +.  Cardiovascular:   Denies chest pain, palpitations, new onset peripheral edema  Gastrointestinal:   Denies nausea, vomiting, diarrhea.  Genitourinary: Denies dysuria, freq/ urgency, flank pain or discharge from genitals.  Endocrine:     Denies hot or cold intolerance, polyuria, polydipsia. Musculoskeletal:   Denies unexplained myalgias, joint swelling, unexplained arthralgias, gait problems.  Skin:  Denies rash, suspicious lesions Neurological:     Denies dizziness, unexplained weakness, numbness  Psychiatric/Behavioral:   Denies mood changes, suicidal or homicidal ideations, hallucinations  Observations/Objective: Pt sounded congested during WebEx. Pt was quite aggressive in speech and manner during video call- ended abruptly ending call and requesting that she not be charged for today's encounter.  Assessment and Plan: Continue Flonase. Continue current OTC remedies. Recommend  SARS-CoV-2 testing-  pt declined. Will not charge for this encounter  Follow Up Instructions: Due to aggressive manner/tone, uncooperative manner during conversation- will discharge from practice.   I discussed the assessment and treatment plan with the patient. The patient was provided an opportunity to ask questions and all were answered. The patient agreed with the plan and demonstrated an understanding of the instructions.   The patient was advised to call back or seek an in-person evaluation if the symptoms worsen or if the condition fails to improve as anticipated.  I provided 15 minutes of non-face-to-face time during this encounter.   Esaw Grandchild, NP

## 2019-07-07 NOTE — Assessment & Plan Note (Signed)
Assessment and Plan: Continue Flonase. Continue current OTC remedies. Recommend  SARS-CoV-2 testing- pt declined. Will not charge for this encounter  Follow Up Instructions: Due to aggressive manner/tone, uncooperative manner during conversation- will discharge from practice.   I discussed the assessment and treatment plan with the patient. The patient was provided an opportunity to ask questions and all were answered. The patient agreed with the plan and demonstrated an understanding of the instructions.   The patient was advised to call back or seek an in-person evaluation if the symptoms worsen or if the condition fails to improve as anticipated.  I provided 15 minutes of non-face-to-face time during this encounter.

## 2019-07-07 NOTE — Telephone Encounter (Signed)
Patient was seen today via Webex for URI symptoms, she had already scheduled a 2 day follow up for ear pain via MyChart. I tried contacting patient to explain that this ear pain could be in conjunction to the URI that was discussed today with PCP and that without a neg COVID test we would not be able to see her in office. Patient cut me off and said that she will reschedule this appt herself and hung up. I tried contacting patient back several more times to give her further details for what we could do for her, but patient repeated picking up the line and hanging up before I could talk. Forwarding this message to PCP and clinic staff for documentation.

## 2019-07-07 NOTE — Telephone Encounter (Signed)
Due to uncooperative/agreesive manner of pt- she will be discharged from practice. No OV charger despite >61mins of clinical time spent on pt during WebEx. Discharge letter will be sent via [place]. Thanks! Valetta Fuller

## 2019-07-07 NOTE — Telephone Encounter (Signed)
Patient called request OV for Possible sinus infection--advise her provider conduct appt via Telehealth or Webex--Pt states wants to be seen by provider.--  Forwarding message to med asst to review with provider if pt can come to office for appt & contact pt [email or web address] [phone]  --glh

## 2019-07-07 NOTE — Telephone Encounter (Signed)
Spoke with pt who states that she has nasal congestion and earaches.  Advised pt that she would need a negative COVID test prior to being seen in the office.  Pt became very upset and stated that this was a ridiculous policy and that she would be seen at [facility] and that she would be seeking to find a new physician.  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  Pt hung up.  Charyl Bigger, CMA

## 2019-07-09 ENCOUNTER — Ambulatory Visit: Payer: 59 | Admitting: Adult Health

## 2019-10-21 ENCOUNTER — Other Ambulatory Visit: Payer: 59

## 2019-10-28 ENCOUNTER — Encounter: Payer: 59 | Admitting: Adult Health

## 2019-12-22 ENCOUNTER — Ambulatory Visit: Payer: 59 | Admitting: Allergy and Immunology

## 2020-02-02 ENCOUNTER — Other Ambulatory Visit: Payer: Self-pay | Admitting: Obstetrics and Gynecology

## 2020-02-02 DIAGNOSIS — R928 Other abnormal and inconclusive findings on diagnostic imaging of breast: Secondary | ICD-10-CM

## 2020-02-09 ENCOUNTER — Other Ambulatory Visit: Payer: Self-pay

## 2020-02-09 ENCOUNTER — Ambulatory Visit
Admission: RE | Admit: 2020-02-09 | Discharge: 2020-02-09 | Disposition: A | Discharge status: Home / Self Care | Payer: 59 | Source: Ambulatory Visit | Attending: Obstetrics and Gynecology | Admitting: Obstetrics and Gynecology

## 2020-02-09 ENCOUNTER — Ambulatory Visit
Admission: RE | Admit: 2020-02-09 | Discharge: 2020-02-09 | Disposition: A | Discharge status: Home / Self Care | Payer: BC Managed Care – PPO | Source: Ambulatory Visit | Attending: Obstetrics and Gynecology | Admitting: Obstetrics and Gynecology

## 2020-02-09 DIAGNOSIS — R928 Other abnormal and inconclusive findings on diagnostic imaging of breast: Secondary | ICD-10-CM

## 2021-02-10 ENCOUNTER — Other Ambulatory Visit: Payer: Self-pay | Admitting: Physician Assistant

## 2021-02-10 DIAGNOSIS — Z1231 Encounter for screening mammogram for malignant neoplasm of breast: Secondary | ICD-10-CM

## 2021-02-13 ENCOUNTER — Ambulatory Visit: Payer: BC Managed Care – PPO

## 2021-02-13 DIAGNOSIS — Z1231 Encounter for screening mammogram for malignant neoplasm of breast: Secondary | ICD-10-CM

## 2021-02-14 ENCOUNTER — Other Ambulatory Visit: Payer: Self-pay

## 2021-02-14 ENCOUNTER — Ambulatory Visit
Admission: RE | Admit: 2021-02-14 | Discharge: 2021-02-14 | Disposition: A | Discharge status: Home / Self Care | Payer: BC Managed Care – PPO | Source: Ambulatory Visit | Attending: Physician Assistant | Admitting: Physician Assistant

## 2021-02-14 DIAGNOSIS — Z1231 Encounter for screening mammogram for malignant neoplasm of breast: Secondary | ICD-10-CM

## 2021-11-21 DIAGNOSIS — J0100 Acute maxillary sinusitis, unspecified: Secondary | ICD-10-CM | POA: Diagnosis not present

## 2021-11-21 DIAGNOSIS — B3731 Acute candidiasis of vulva and vagina: Secondary | ICD-10-CM | POA: Diagnosis not present

## 2022-01-18 ENCOUNTER — Other Ambulatory Visit: Payer: Self-pay | Admitting: Obstetrics and Gynecology

## 2022-01-18 DIAGNOSIS — Z1231 Encounter for screening mammogram for malignant neoplasm of breast: Secondary | ICD-10-CM

## 2022-02-19 ENCOUNTER — Ambulatory Visit
Admission: RE | Admit: 2022-02-19 | Discharge: 2022-02-19 | Disposition: A | Discharge status: Home / Self Care | Payer: BC Managed Care – PPO | Source: Ambulatory Visit | Attending: Obstetrics and Gynecology | Admitting: Obstetrics and Gynecology

## 2022-02-19 DIAGNOSIS — Z1231 Encounter for screening mammogram for malignant neoplasm of breast: Secondary | ICD-10-CM | POA: Diagnosis not present

## 2022-03-22 DIAGNOSIS — K5909 Other constipation: Secondary | ICD-10-CM | POA: Diagnosis not present

## 2022-03-22 DIAGNOSIS — N898 Other specified noninflammatory disorders of vagina: Secondary | ICD-10-CM | POA: Diagnosis not present

## 2022-03-22 DIAGNOSIS — F419 Anxiety disorder, unspecified: Secondary | ICD-10-CM | POA: Diagnosis not present

## 2022-03-22 DIAGNOSIS — Z01419 Encounter for gynecological examination (general) (routine) without abnormal findings: Secondary | ICD-10-CM | POA: Diagnosis not present

## 2022-03-22 DIAGNOSIS — Z6826 Body mass index (BMI) 26.0-26.9, adult: Secondary | ICD-10-CM | POA: Diagnosis not present

## 2022-03-30 DIAGNOSIS — M25531 Pain in right wrist: Secondary | ICD-10-CM | POA: Diagnosis not present

## 2022-03-30 DIAGNOSIS — S60211A Contusion of right wrist, initial encounter: Secondary | ICD-10-CM | POA: Diagnosis not present

## 2022-09-12 DIAGNOSIS — D509 Iron deficiency anemia, unspecified: Secondary | ICD-10-CM | POA: Diagnosis not present

## 2022-09-12 DIAGNOSIS — Z30432 Encounter for removal of intrauterine contraceptive device: Secondary | ICD-10-CM | POA: Diagnosis not present

## 2022-09-18 DIAGNOSIS — R070 Pain in throat: Secondary | ICD-10-CM | POA: Diagnosis not present

## 2022-10-30 DIAGNOSIS — J0140 Acute pansinusitis, unspecified: Secondary | ICD-10-CM | POA: Diagnosis not present

## 2023-01-03 DIAGNOSIS — M542 Cervicalgia: Secondary | ICD-10-CM | POA: Diagnosis not present

## 2023-01-03 DIAGNOSIS — S29012A Strain of muscle and tendon of back wall of thorax, initial encounter: Secondary | ICD-10-CM | POA: Diagnosis not present

## 2023-01-11 IMAGING — MG DIGITAL SCREENING BREAST BILAT IMPLANT W/ TOMO W/ CAD
9 of 12 series · 9 of 28 positions shown · non-contrast
Comparison: Previous exams.

CLINICAL DATA: Screening.

EXAM:
DIGITAL SCREENING BILATERAL MAMMOGRAM WITH IMPLANTS, CAD AND
TOMOSYNTHESIS
TECHNIQUE: Bilateral screening digital craniocaudal and mediolateral oblique
mammograms were obtained. Bilateral screening digital breast
tomosynthesis was performed. The images were evaluated with
computer-aided detection. Standard and/or implant displaced views
were performed.

[R CC]
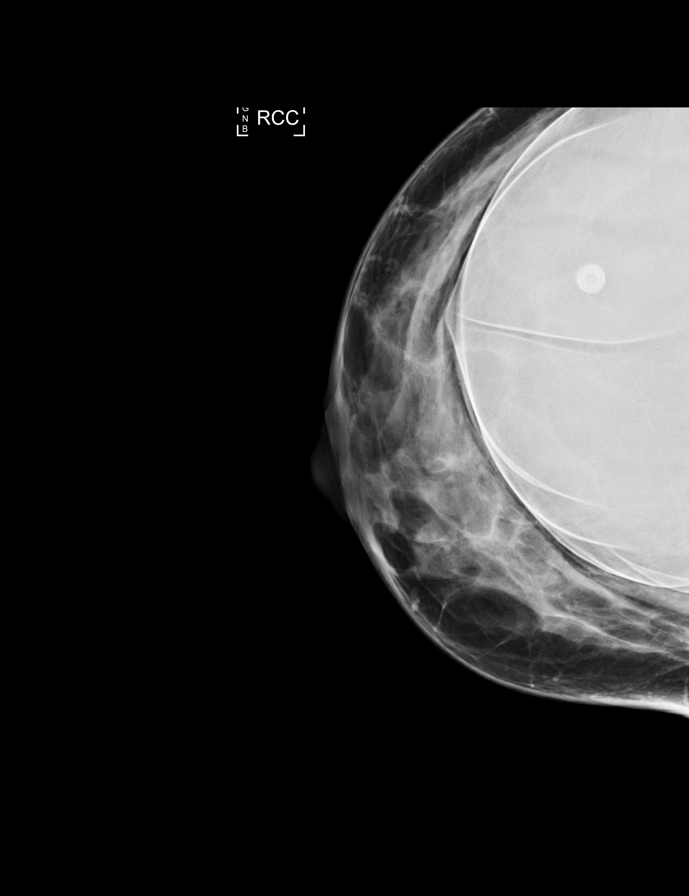

[R MLO]
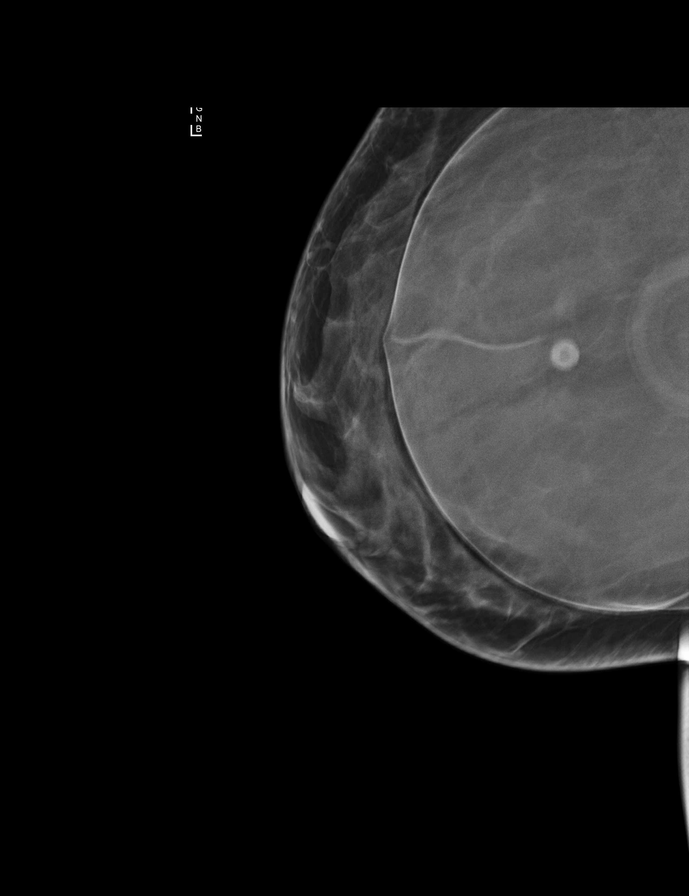

[L CC]
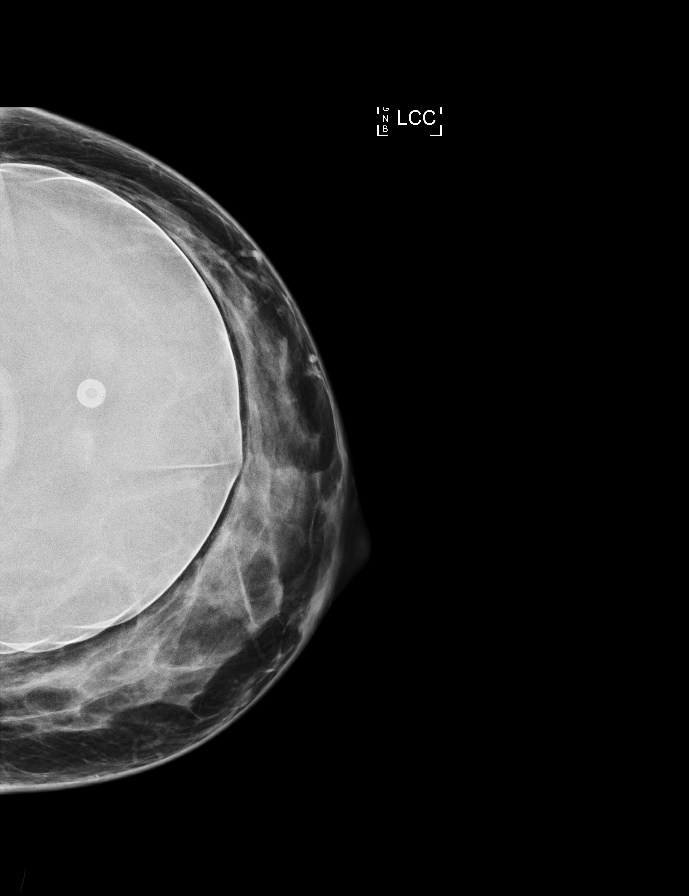

[L MLO]
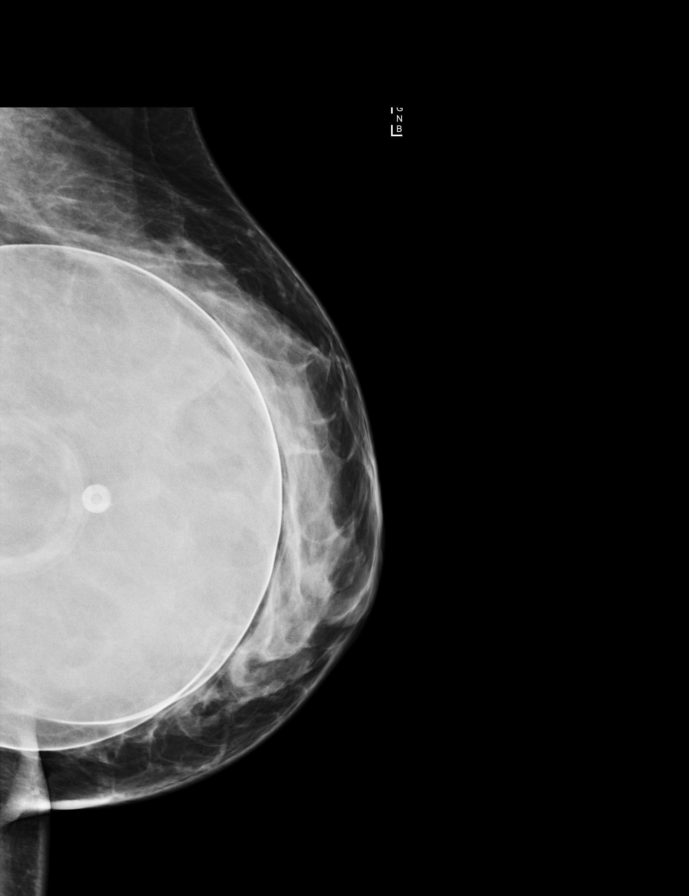

[L MLO synth-2D]
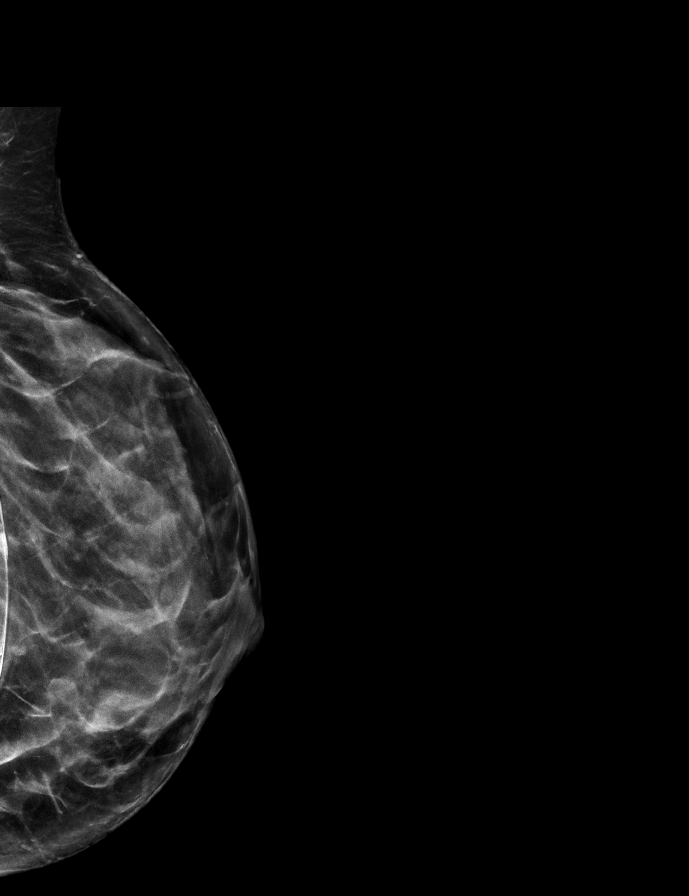

[L CC synth-2D]
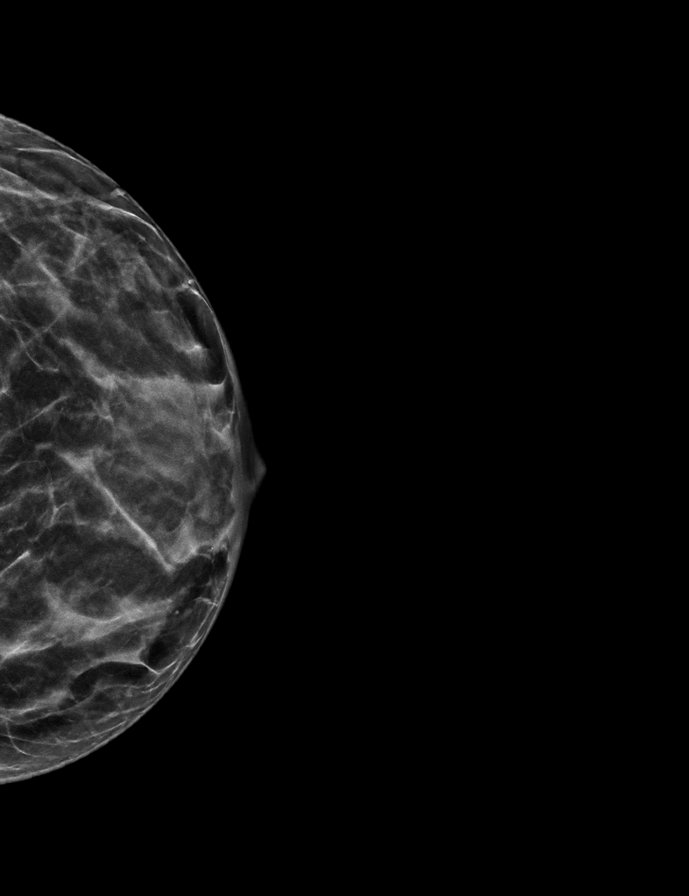

[R MLO synth-2D]
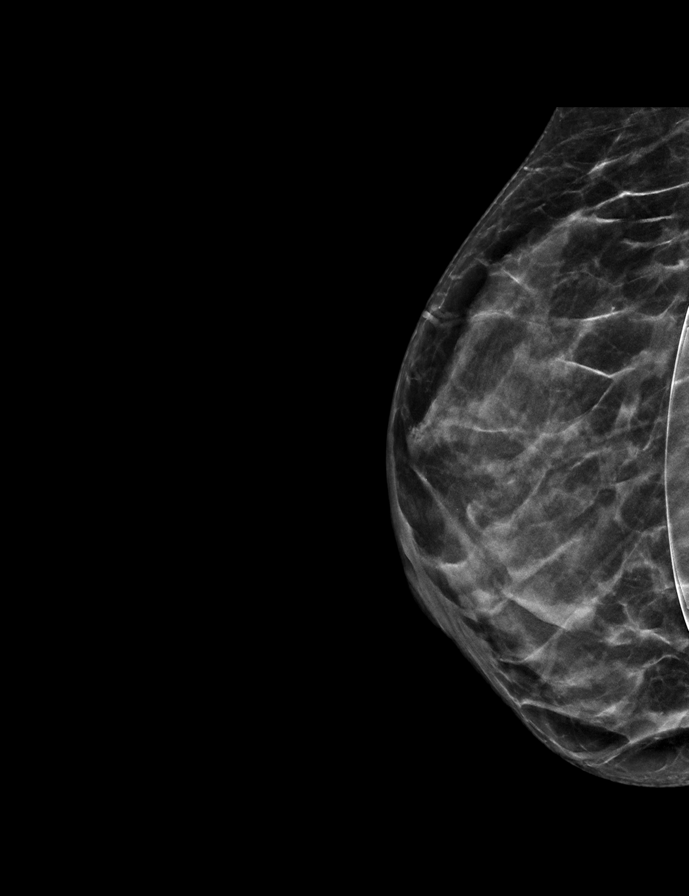

[R CC synth-2D]
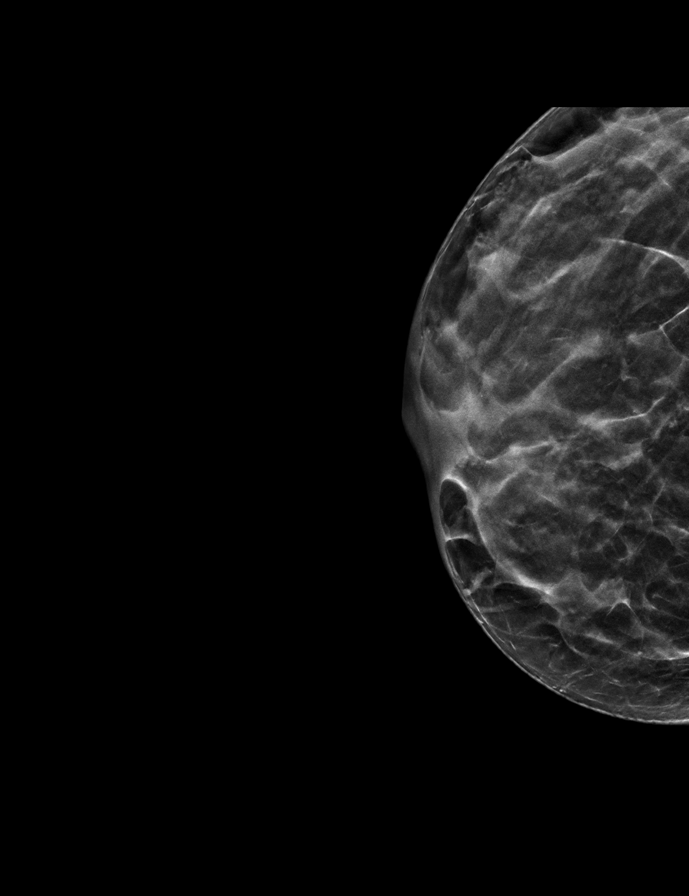

[L MLOID BREAST TOMOSYNTHESIS IMAGE tomo · tomo slice 27/54.0]
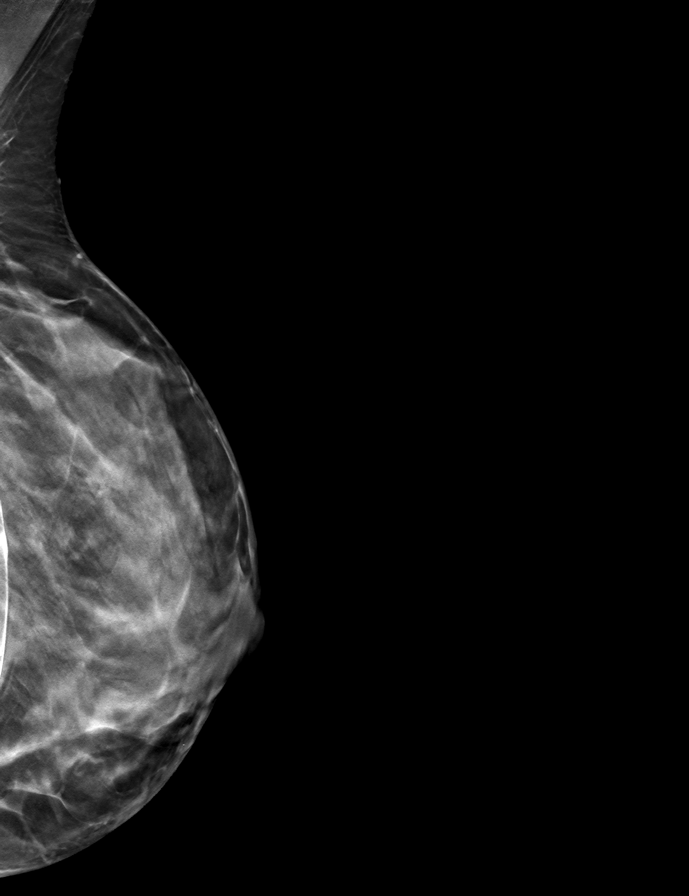

[9 of 28 positions shown; findings below may reference images not displayed]

ACR Breast Density Category d: The breast tissue is extremely dense,
which lowers the sensitivity of mammography.
FINDINGS: The patient has bilateral retropectoral saline implants. There are
no findings suspicious for malignancy.
IMPRESSION: No mammographic evidence of malignancy. A result letter of this
screening mammogram will be mailed directly to the patient.

RECOMMENDATION:
Screening mammogram in one year. (Code:[id number]-N6W)

BI-RADS CATEGORY  1:  Negative.

## 2023-01-23 DIAGNOSIS — N920 Excessive and frequent menstruation with regular cycle: Secondary | ICD-10-CM | POA: Diagnosis not present

## 2023-01-23 DIAGNOSIS — N921 Excessive and frequent menstruation with irregular cycle: Secondary | ICD-10-CM | POA: Diagnosis not present

## 2023-02-26 DIAGNOSIS — M79672 Pain in left foot: Secondary | ICD-10-CM | POA: Diagnosis not present

## 2023-03-04 DIAGNOSIS — N840 Polyp of corpus uteri: Secondary | ICD-10-CM | POA: Diagnosis not present

## 2023-03-04 DIAGNOSIS — N920 Excessive and frequent menstruation with regular cycle: Secondary | ICD-10-CM | POA: Diagnosis not present

## 2023-03-14 DIAGNOSIS — S92515A Nondisplaced fracture of proximal phalanx of left lesser toe(s), initial encounter for closed fracture: Secondary | ICD-10-CM | POA: Diagnosis not present

## 2023-03-14 DIAGNOSIS — S92345A Nondisplaced fracture of fourth metatarsal bone, left foot, initial encounter for closed fracture: Secondary | ICD-10-CM | POA: Diagnosis not present

## 2023-04-10 DIAGNOSIS — Z01419 Encounter for gynecological examination (general) (routine) without abnormal findings: Secondary | ICD-10-CM | POA: Diagnosis not present

## 2023-04-10 DIAGNOSIS — Z6826 Body mass index (BMI) 26.0-26.9, adult: Secondary | ICD-10-CM | POA: Diagnosis not present

## 2023-04-11 DIAGNOSIS — S92345A Nondisplaced fracture of fourth metatarsal bone, left foot, initial encounter for closed fracture: Secondary | ICD-10-CM | POA: Diagnosis not present

## 2023-04-11 DIAGNOSIS — S92515A Nondisplaced fracture of proximal phalanx of left lesser toe(s), initial encounter for closed fracture: Secondary | ICD-10-CM | POA: Diagnosis not present

## 2023-04-11 DIAGNOSIS — M722 Plantar fascial fibromatosis: Secondary | ICD-10-CM | POA: Diagnosis not present

## 2023-04-17 ENCOUNTER — Other Ambulatory Visit: Payer: Self-pay | Admitting: Obstetrics and Gynecology

## 2023-04-17 DIAGNOSIS — Z1231 Encounter for screening mammogram for malignant neoplasm of breast: Secondary | ICD-10-CM

## 2023-04-25 ENCOUNTER — Ambulatory Visit
Admission: RE | Admit: 2023-04-25 | Discharge: 2023-04-25 | Disposition: A | Discharge status: Home / Self Care | Payer: BC Managed Care – PPO | Source: Ambulatory Visit | Attending: Obstetrics and Gynecology | Admitting: Obstetrics and Gynecology

## 2023-04-25 DIAGNOSIS — Z1231 Encounter for screening mammogram for malignant neoplasm of breast: Secondary | ICD-10-CM

## 2023-05-27 DIAGNOSIS — M79672 Pain in left foot: Secondary | ICD-10-CM | POA: Diagnosis not present

## 2023-05-28 ENCOUNTER — Other Ambulatory Visit: Payer: Self-pay | Admitting: Orthopaedic Surgery

## 2023-05-28 DIAGNOSIS — M79672 Pain in left foot: Secondary | ICD-10-CM

## 2023-06-05 DIAGNOSIS — M25775 Osteophyte, left foot: Secondary | ICD-10-CM | POA: Diagnosis not present

## 2023-06-05 DIAGNOSIS — L03032 Cellulitis of left toe: Secondary | ICD-10-CM | POA: Diagnosis not present

## 2023-06-05 DIAGNOSIS — L02612 Cutaneous abscess of left foot: Secondary | ICD-10-CM | POA: Diagnosis not present

## 2023-06-13 ENCOUNTER — Ambulatory Visit (INDEPENDENT_AMBULATORY_CARE_PROVIDER_SITE_OTHER): Payer: BC Managed Care – PPO | Admitting: Dermatology

## 2023-06-13 ENCOUNTER — Encounter: Payer: Self-pay | Admitting: Dermatology

## 2023-06-13 ENCOUNTER — Ambulatory Visit: Payer: BC Managed Care – PPO

## 2023-06-13 VITALS — BP 116/80 | HR 74

## 2023-06-13 DIAGNOSIS — W908XXA Exposure to other nonionizing radiation, initial encounter: Secondary | ICD-10-CM

## 2023-06-13 DIAGNOSIS — L821 Other seborrheic keratosis: Secondary | ICD-10-CM

## 2023-06-13 DIAGNOSIS — L814 Other melanin hyperpigmentation: Secondary | ICD-10-CM

## 2023-06-13 DIAGNOSIS — L578 Other skin changes due to chronic exposure to nonionizing radiation: Secondary | ICD-10-CM

## 2023-06-13 DIAGNOSIS — D229 Melanocytic nevi, unspecified: Secondary | ICD-10-CM | POA: Diagnosis not present

## 2023-06-13 DIAGNOSIS — Z1283 Encounter for screening for malignant neoplasm of skin: Secondary | ICD-10-CM

## 2023-06-13 NOTE — Progress Notes (Signed)
   New Patient Visit   Subjective  Andrea Clark is a 42 y.o. female who presents for the following: Skin Cancer Screening and Full Body Skin Exam  The patient presents for Total-Body Skin Exam (TBSE) for skin cancer screening and mole check. The patient has spots, moles and lesions to be evaluated, some may be new or changing and the patient may have concern these could be cancer. Pt has no hx of skin cancer but possible hx of skin cancer She has never had a skin check Pt states she has growth on left arm for a few years but in the last 6 months it has grown.  The following portions of the chart were reviewed this encounter and updated as appropriate: medications, allergies, medical history  Review of Systems:  No other skin or systemic complaints except as noted in HPI or Assessment and Plan.  Objective  Well appearing patient in no apparent distress; mood and affect are within normal limits.  A full examination was performed including scalp, head, eyes, ears, nose, lips, neck, chest, axillae, abdomen, back, buttocks, bilateral upper extremities, bilateral lower extremities, hands, feet, fingers, toes, fingernails, and toenails. All findings within normal limits unless otherwise noted below.   Relevant physical exam findings are noted in the Assessment and Plan.   Assessment & Plan   SKIN CANCER SCREENING PERFORMED TODAY.  ACTINIC DAMAGE - Recommend daily broad spectrum sunscreen SPF 30+ to sun-exposed areas, reapply every 2 hours as needed.  - Staying in the shade or wearing long sleeves, sun glasses (UVA+UVB protection) and wide brim hats (4-inch brim around the entire circumference of the hat) are also recommended for sun protection.  - Call for new or changing lesions.  MELANOCYTIC NEVI - Tan-brown and/or pink-flesh-colored symmetric macules and papules - Benign appearing on exam today - Observation - Call clinic for new or changing moles - Recommend daily use of  broad spectrum spf 30+ sunscreen to sun-exposed areas.   SEBORRHEIC KERATOSIS - Stuck-on, waxy, tan-brown papules and/or plaques all over body - Benign-appearing - Discussed benign etiology and prognosis. - Observe - Call for any changes  LENTIGINES Exam: scattered tan macules Due to sun exposure Treatment Plan: Benign-appearing, observe. Recommend daily broad spectrum sunscreen SPF 30+ to sun-exposed areas, reapply every 2 hours as needed.  Call for any changes    Return in about 2 years (around 06/12/2025) for TBSE.  I, Tillie Fantasia, CMA, am acting as scribe for Gwenith Daily, MD.   Documentation: I have reviewed the above documentation for accuracy and completeness, and I agree with the above.  Gwenith Daily, MD

## 2023-06-13 NOTE — Patient Instructions (Signed)
Skin Education :  Wynelle Link screen (SPF 30 or greater) should be applied during peak UV exposure (between 10am and 2pm) and reapplied after exercise or swimming.  The ABCDEs of melanoma were reviewed with the patient, and the importance of monthly self-examination of moles was emphasized. Should any moles change in shape or color, or itch, bleed or burn, pt will contact our office for evaluation sooner then their interval appointment.  Plan: Sunscreen Recommendations We recommended a broad spectrum sunscreen with a SPF of 30 or higher.  SPF 30 sunscreens block approximately 97 percent of the sun's harmful rays. Sunscreens should be applied at least 15 minutes prior to expected sun exposure and then every 2 hours after that as long as sun exposure continues. If swimming or exercising sunscreen should be reapplied every 45 minutes to an hour after getting wet or sweating. One ounce, or the equivalent of a shot glass full of sunscreen, is adequate to protect the skin not covered by a bathing suit. We also recommended a lip balm with a sunscreen as well. Sun protective clothing can be used in lieu of sunscreen but must be worn the entire time you are exposed to the sun's rays.  Important Information  Due to recent changes in healthcare laws, you may see results of your pathology and/or laboratory studies on MyChart before the doctors have had a chance to review them. We understand that in some cases there may be results that are confusing or concerning to you. Please understand that not all results are received at the same time and often the doctors may need to interpret multiple results in order to provide you with the best plan of care or course of treatment. Therefore, we ask that you please give [place] 2 business days to thoroughly review all your results before contacting the office for clarification. Should we see a critical lab result, you will be contacted sooner.   If You Need Anything After Your  Visit  If you have any questions or concerns for your doctor, please call our main line at [phone] If no one answers, please leave a voicemail as directed and we will return your call as soon as possible. Messages left after 4 pm will be answered the following business day.   You may also send [place] a message via MyChart. We typically respond to MyChart messages within 1-2 business days.  For prescription refills, please ask your pharmacy to contact our office. Our fax number is [phone].  If you have an urgent issue when the clinic is closed that cannot wait until the next business day, you can page your doctor at the number below.    Please note that while we do our best to be available for urgent issues outside of office hours, we are not available 24/7.   If you have an urgent issue and are unable to reach [place], you may choose to seek medical care at your doctor's office, retail clinic, urgent care center, or emergency room.  If you have a medical emergency, please immediately call 911 or go to the emergency department. In the event of inclement weather, please call our main line at [phone] for an update on the status of any delays or closures.  Dermatology Medication Tips: Please keep the boxes that topical medications come in in order to help keep track of the instructions about where and how to use these. Pharmacies typically print the medication instructions only on the boxes and not directly on the  medication tubes.   If your medication is too expensive, please contact our office at [phone] or send [place] a message through MyChart.   We are unable to tell what your co-pay for medications will be in advance as this is different depending on your insurance coverage. However, we may be able to find a substitute medication at lower cost or fill out paperwork to get insurance to cover a needed medication.   If a prior authorization is required to get your medication covered by  your insurance company, please allow [place] 1-2 business days to complete this process.  Drug prices often vary depending on where the prescription is filled and some pharmacies may offer cheaper prices.  The website www.goodrx.com contains coupons for medications through different pharmacies. The prices here do not account for what the cost may be with help from insurance (it may be cheaper with your insurance), but the website can give you the price if you did not use any insurance.  - You can print the associated coupon and take it with your prescription to the pharmacy.  - You may also stop by our office during regular business hours and pick up a GoodRx coupon card.  - If you need your prescription sent electronically to a different pharmacy, notify our office through [facility] or by phone at [phone]

## 2023-06-17 DIAGNOSIS — M2021 Hallux rigidus, right foot: Secondary | ICD-10-CM | POA: Diagnosis not present

## 2023-06-17 DIAGNOSIS — M2022 Hallux rigidus, left foot: Secondary | ICD-10-CM | POA: Diagnosis not present

## 2023-08-05 DIAGNOSIS — J0140 Acute pansinusitis, unspecified: Secondary | ICD-10-CM | POA: Diagnosis not present

## 2023-08-23 DIAGNOSIS — K122 Cellulitis and abscess of mouth: Secondary | ICD-10-CM | POA: Diagnosis not present

## 2023-08-23 DIAGNOSIS — R070 Pain in throat: Secondary | ICD-10-CM | POA: Diagnosis not present

## 2023-09-23 DIAGNOSIS — M25572 Pain in left ankle and joints of left foot: Secondary | ICD-10-CM | POA: Diagnosis not present

## 2023-10-25 DIAGNOSIS — G5762 Lesion of plantar nerve, left lower limb: Secondary | ICD-10-CM | POA: Diagnosis not present

## 2023-11-07 DIAGNOSIS — J209 Acute bronchitis, unspecified: Secondary | ICD-10-CM | POA: Diagnosis not present

## 2023-12-12 DIAGNOSIS — N390 Urinary tract infection, site not specified: Secondary | ICD-10-CM | POA: Diagnosis not present

## 2024-08-24 DIAGNOSIS — R070 Pain in throat: Secondary | ICD-10-CM | POA: Diagnosis not present

## 2024-08-24 DIAGNOSIS — J0140 Acute pansinusitis, unspecified: Secondary | ICD-10-CM | POA: Diagnosis not present

## 2024-09-01 DIAGNOSIS — Z13220 Encounter for screening for lipoid disorders: Secondary | ICD-10-CM | POA: Diagnosis not present

## 2024-09-01 DIAGNOSIS — Z0000 Encounter for general adult medical examination without abnormal findings: Secondary | ICD-10-CM | POA: Diagnosis not present

## 2024-09-01 DIAGNOSIS — R946 Abnormal results of thyroid function studies: Secondary | ICD-10-CM | POA: Diagnosis not present

## 2024-09-01 DIAGNOSIS — Z1329 Encounter for screening for other suspected endocrine disorder: Secondary | ICD-10-CM | POA: Diagnosis not present

## 2024-09-09 DIAGNOSIS — J0140 Acute pansinusitis, unspecified: Secondary | ICD-10-CM | POA: Diagnosis not present
